# Patient Record
Sex: Male | Born: 1945 | Hispanic: Yes | Marital: Married | Smoking: Former smoker
Health system: Southern US, Community
[De-identification: ages and names within clinical notes are randomized; demographics above are authoritative.]

## PROBLEM LIST (undated history)

## (undated) DIAGNOSIS — E785 Hyperlipidemia, unspecified: Secondary | ICD-10-CM

## (undated) DIAGNOSIS — M1990 Unspecified osteoarthritis, unspecified site: Secondary | ICD-10-CM

## (undated) DIAGNOSIS — R12 Heartburn: Secondary | ICD-10-CM

## (undated) DIAGNOSIS — I10 Essential (primary) hypertension: Secondary | ICD-10-CM

## (undated) DIAGNOSIS — M75100 Unspecified rotator cuff tear or rupture of unspecified shoulder, not specified as traumatic: Secondary | ICD-10-CM

## (undated) DIAGNOSIS — M170 Bilateral primary osteoarthritis of knee: Secondary | ICD-10-CM

## (undated) DIAGNOSIS — R7303 Prediabetes: Secondary | ICD-10-CM

## (undated) DIAGNOSIS — M25511 Pain in right shoulder: Secondary | ICD-10-CM

## (undated) DIAGNOSIS — N400 Enlarged prostate without lower urinary tract symptoms: Secondary | ICD-10-CM

## (undated) HISTORY — DX: Heartburn: R12

## (undated) HISTORY — PX: OTHER SURGICAL HISTORY: SHX169

## (undated) HISTORY — DX: Essential (primary) hypertension: I10

## (undated) HISTORY — DX: Unspecified osteoarthritis, unspecified site: M19.90

## (undated) HISTORY — DX: Hyperlipidemia, unspecified: E78.5

---

## 2010-06-23 ENCOUNTER — Ambulatory Visit: Payer: Self-pay | Admitting: Internal Medicine

## 2010-12-19 IMAGING — US US PELVIS LIMITED
1 series · 17 of 25 positions shown · non-contrast
Comparison: none

REASON FOR EXAM: R TESTICULAR PAIN
COMMENTS:

[Series 1: us pelvis limited · 17 of 97 slices shown]
[im 1/97]
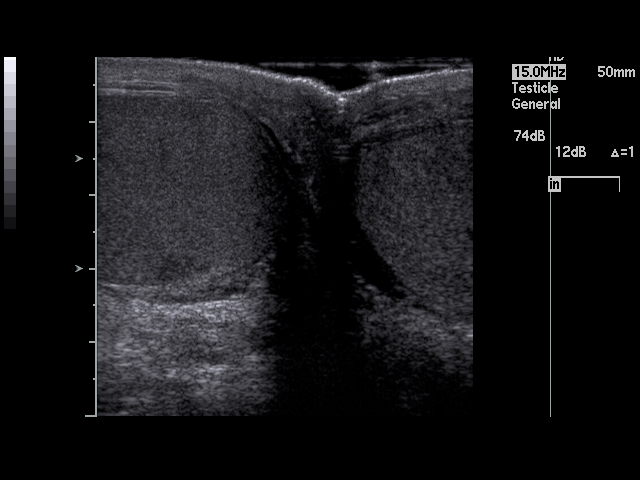
[im 9/97]
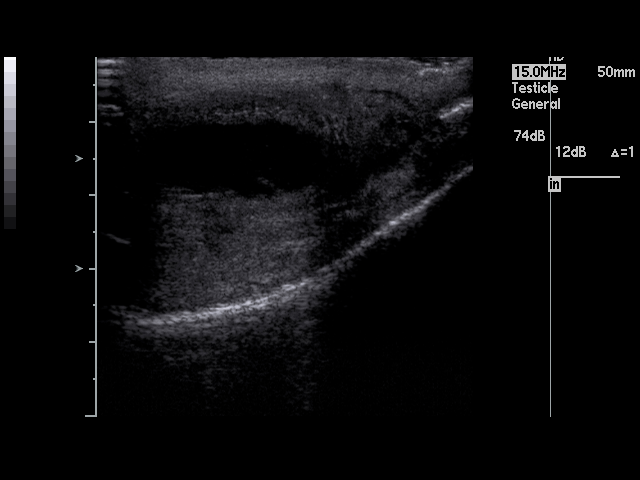
[im 13/97]
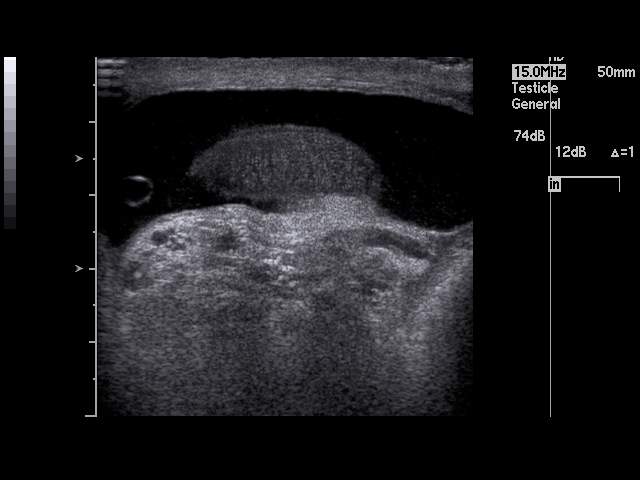
[im 21/97]
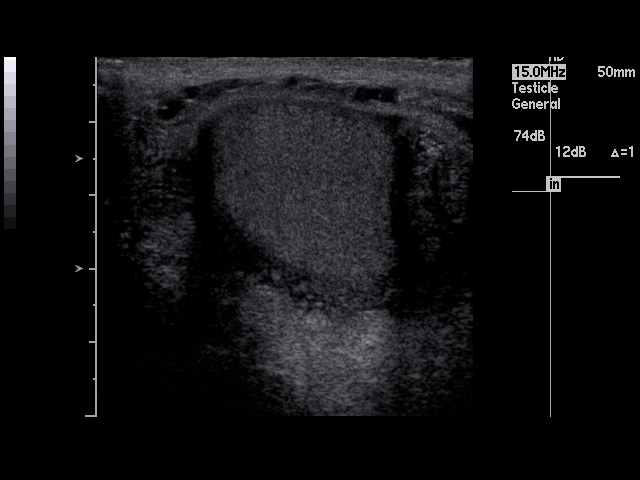
[im 25/97]
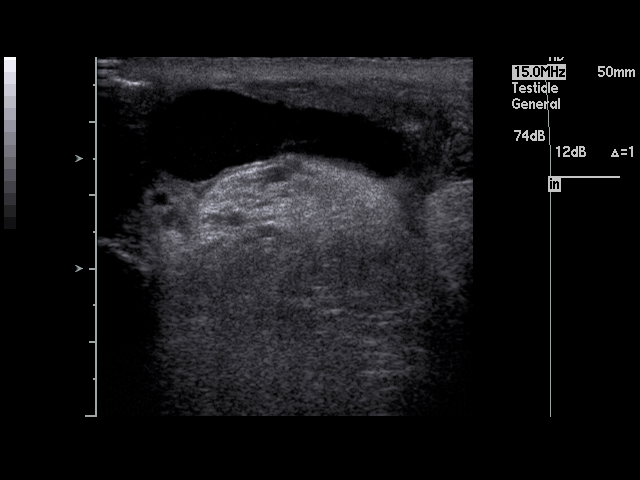
[im 33/97]
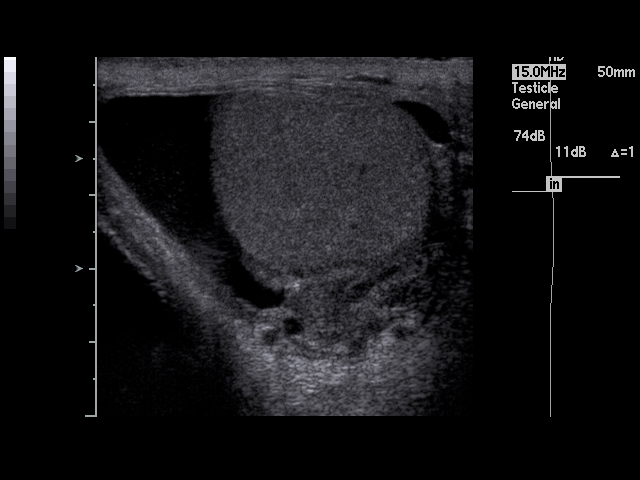
[im 37/97]
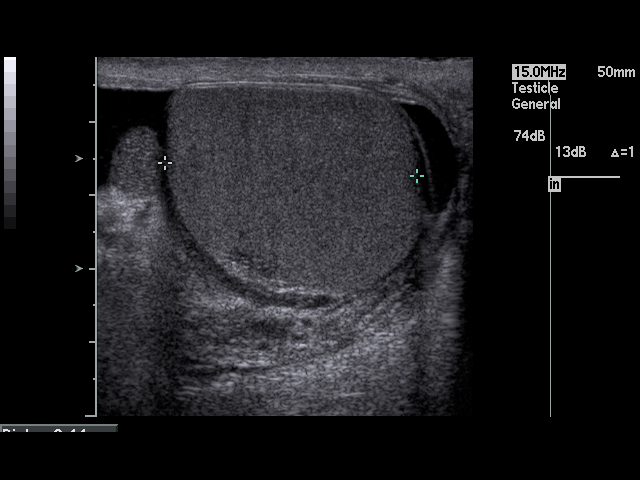
[im 45/97]
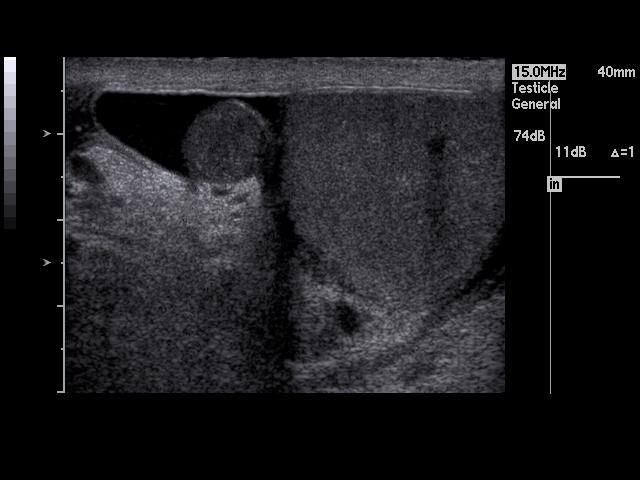
[im 49/97]
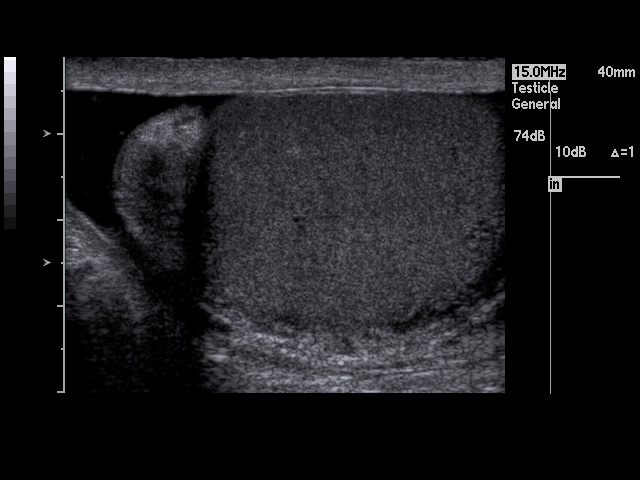
[im 53/97]
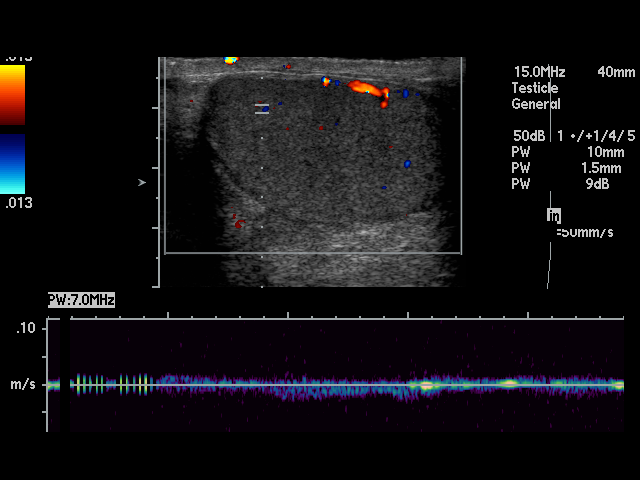
[im 61/97]
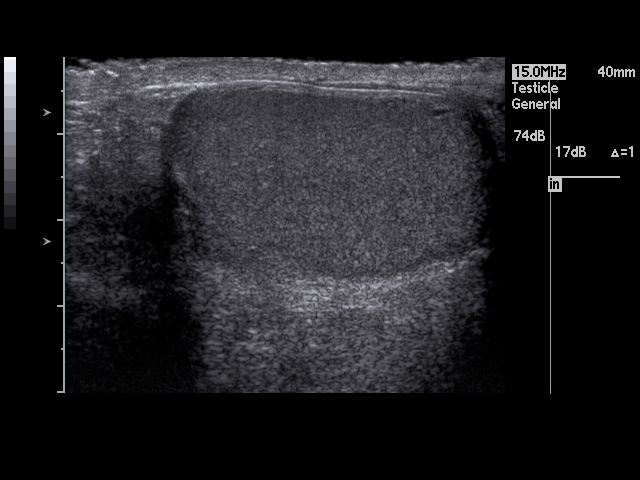
[im 65/97]
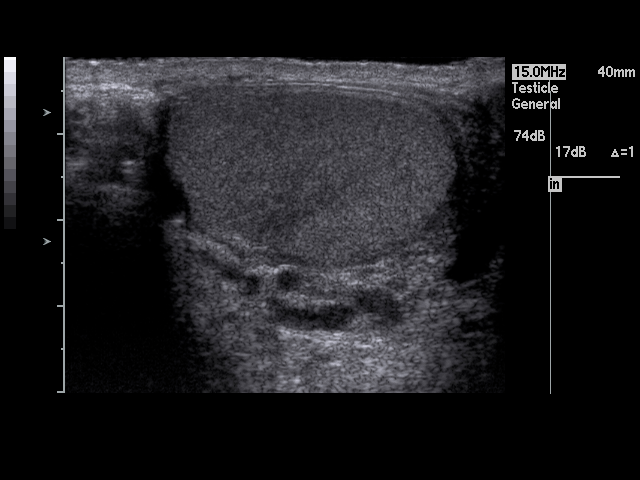
[im 73/97]
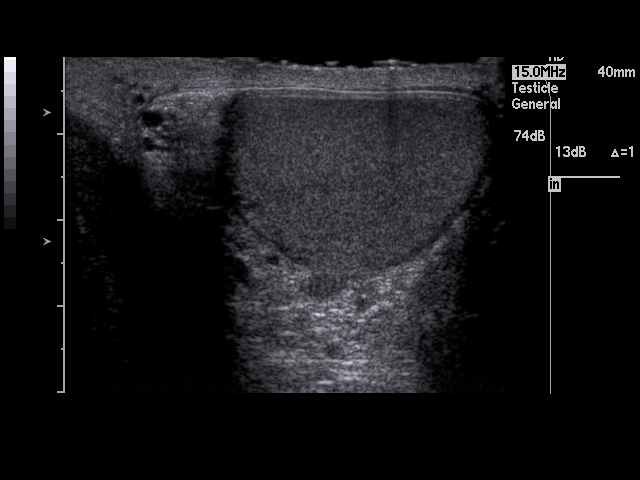
[im 77/97]
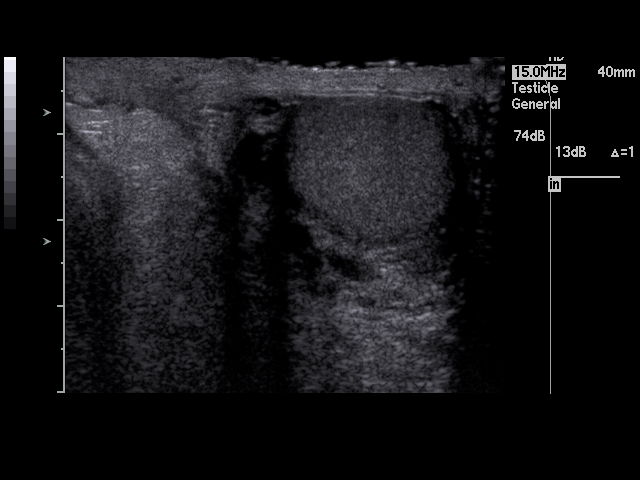
[im 85/97]
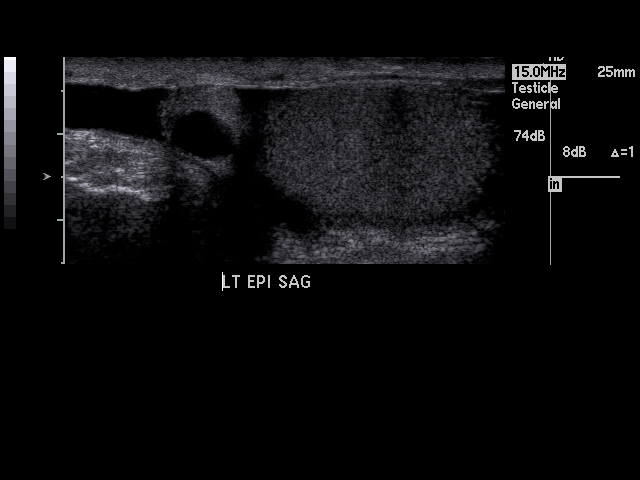
[im 89/97]
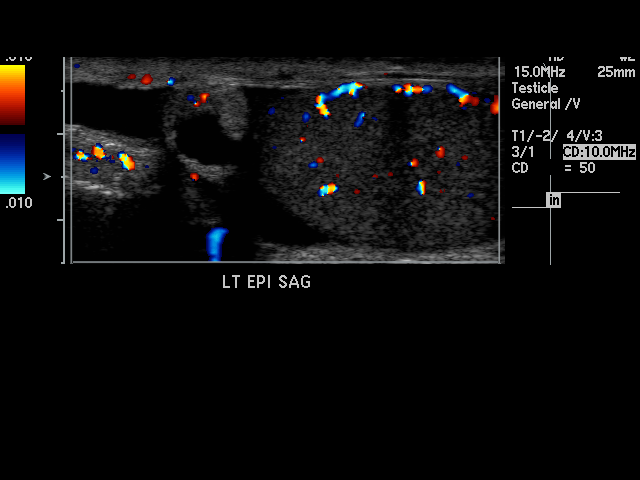
[im 97/97]
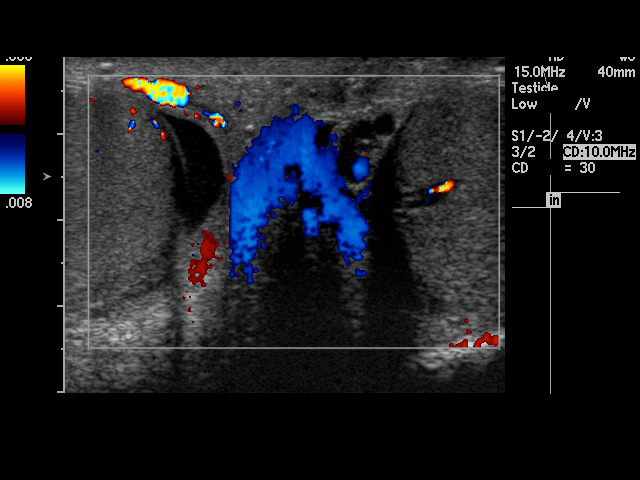

[17 of 25 positions shown; findings below may reference images not displayed]

PROCEDURE:     KODAK - KODAK TESTICULAR  - June 23, 2010  [DATE]

RESULT:     The right testicle measures 4.78 cm x 3.44 cm x 3.01 cm and the
left testicle measures 4.38 cm x 3.41 cm x 2.4 cm. No intratesticular mass
lesions are seen. Doppler examination shows venous and arterial vascular
flow in each testicle. There is no evidence for torsion. The right
epididymis is larger than the left and is hyperemic which suggest right
epididymitis. There is a 7.6 mm epididymal cyst on the left. Bilateral
hydroceles are observed. There is a relatively small varicocele on the left.
IMPRESSION: 1. No torsion is identified.
2. The right epididymis is hyperemic, suspicious for right epididymitis.
3. There is a left epididymal cyst.
4. Bilateral hydroceles are seen.
5. There is a small left varicocele.

## 2012-03-29 ENCOUNTER — Ambulatory Visit: Payer: Self-pay | Admitting: Urology

## 2012-05-31 ENCOUNTER — Ambulatory Visit: Payer: Self-pay | Admitting: Urology

## 2012-05-31 DIAGNOSIS — I10 Essential (primary) hypertension: Secondary | ICD-10-CM

## 2012-05-31 LAB — CBC
HGB: 14.8 g/dL (ref 13.0–18.0)
MCH: 31.7 pg (ref 26.0–34.0)
MCV: 95 fL (ref 80–100)
Platelet: 189 10*3/uL (ref 150–440)
RBC: 4.67 10*6/uL (ref 4.40–5.90)
RDW: 13 % (ref 11.5–14.5)

## 2012-05-31 LAB — POTASSIUM: Potassium: 4.3 mmol/L (ref 3.5–5.1)

## 2012-06-18 ENCOUNTER — Ambulatory Visit: Payer: Self-pay | Admitting: Urology

## 2012-09-24 IMAGING — US US PELVIS LIMITED
1 series · 13 of 25 positions shown · non-contrast
Comparison: none

REASON FOR EXAM: hydrocele
COMMENTS:

[Series 1: us pelvis limited · 0.10mm/px · 13 of 78 slices shown]
[im 1/78]
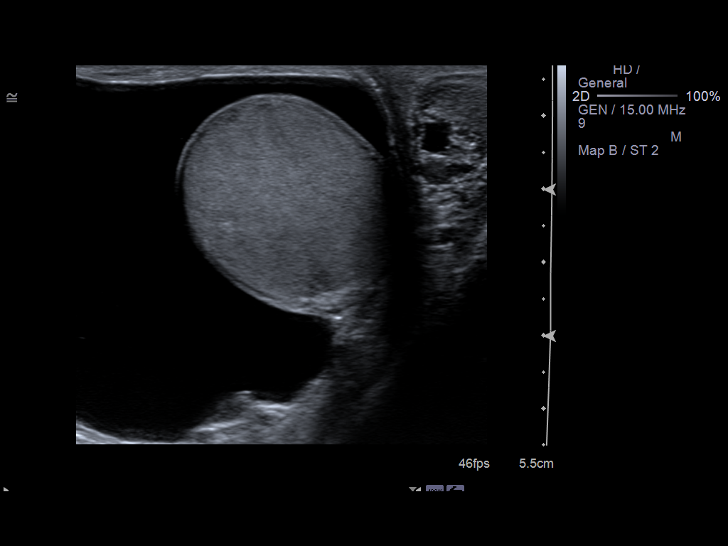
[im 7/78]
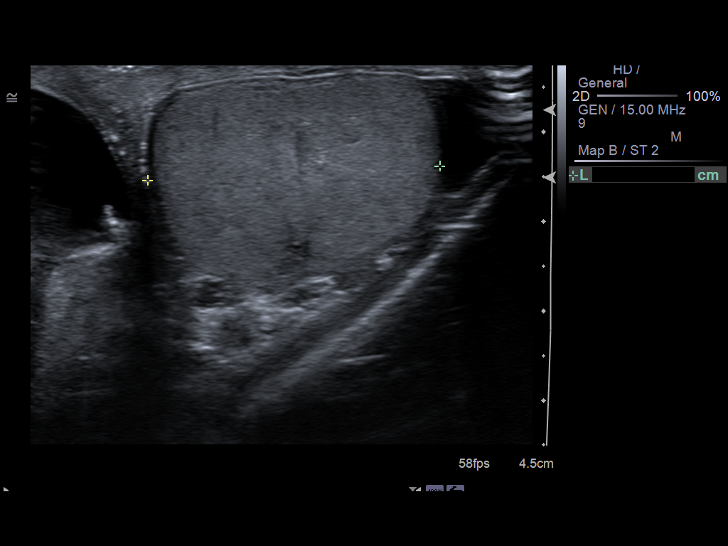
[im 13/78]
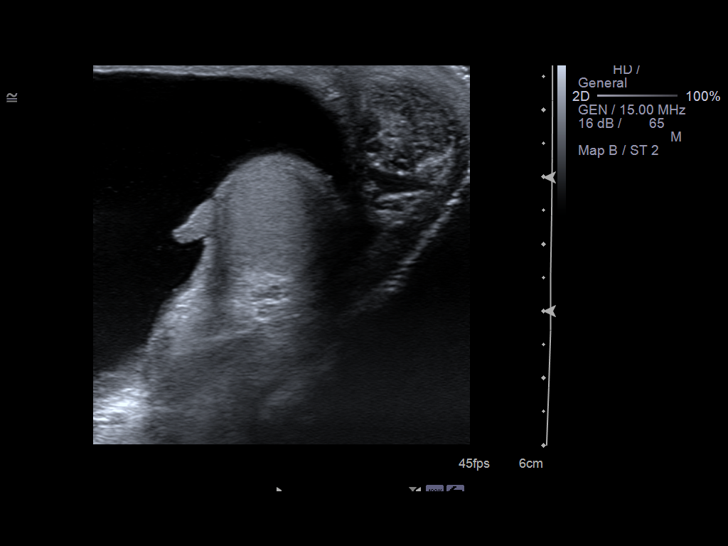
[im 20/78]
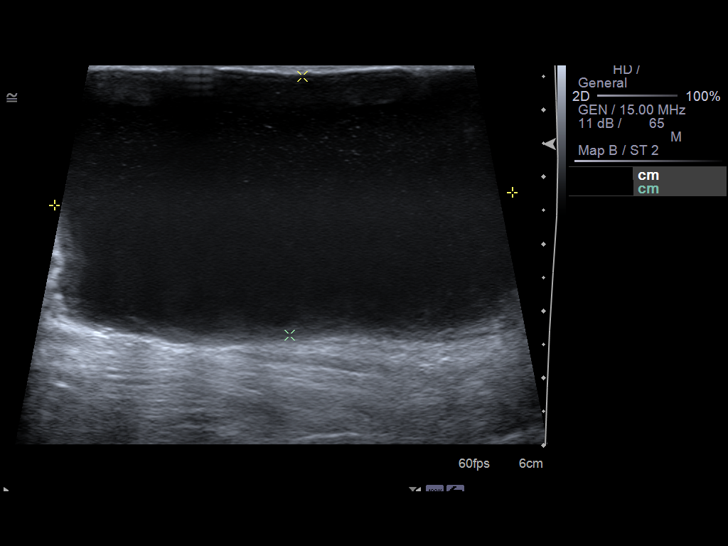
[im 26/78]
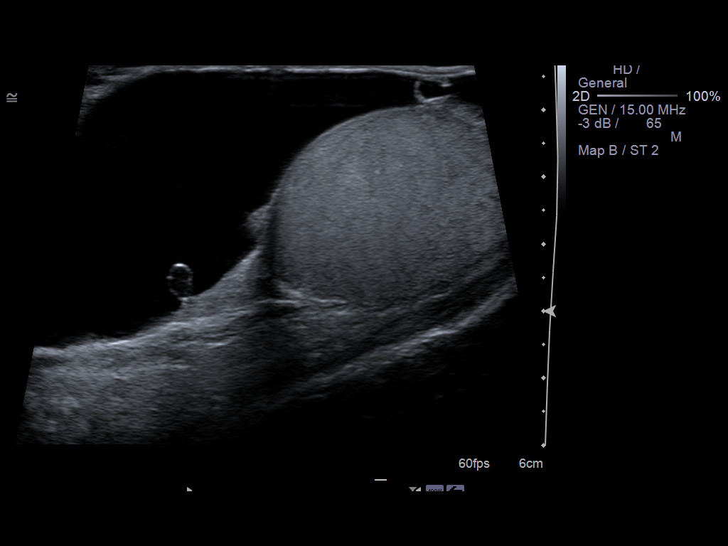
[im 33/78]
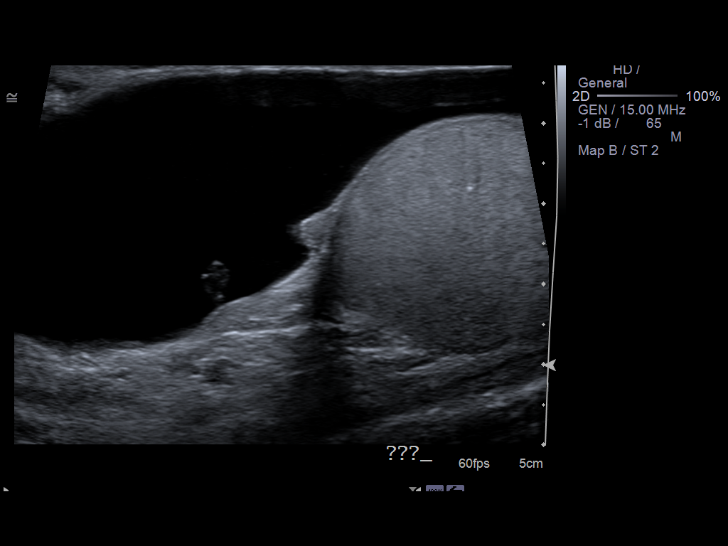
[im 39/78]
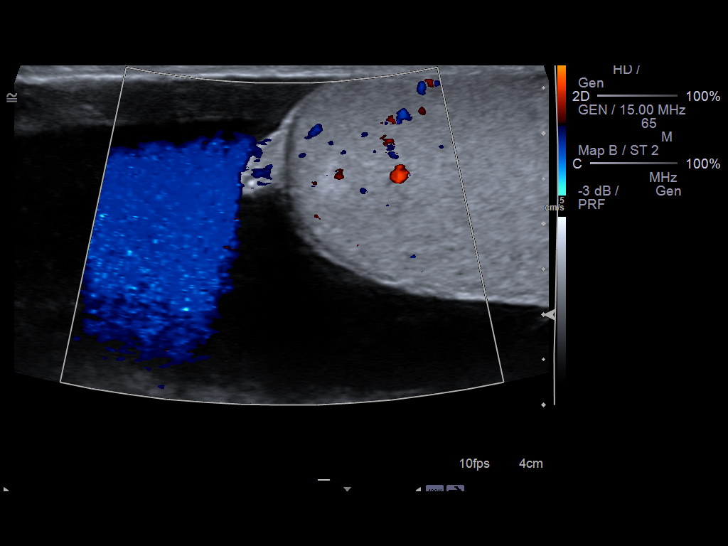
[im 45/78]
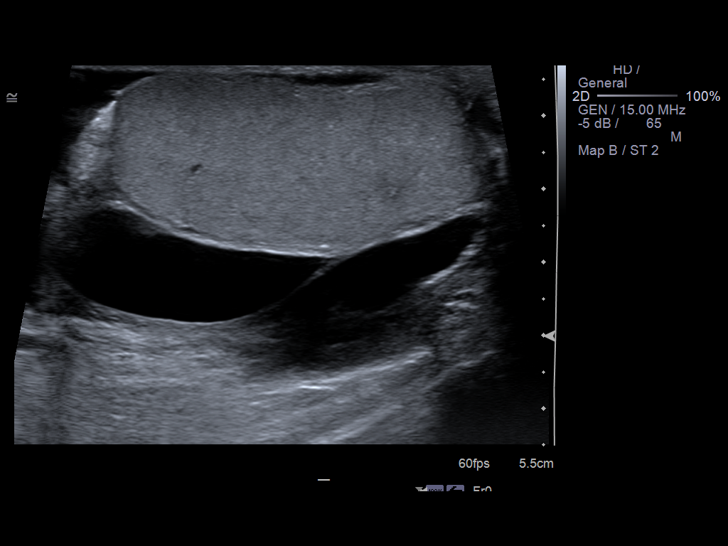
[im 52/78]
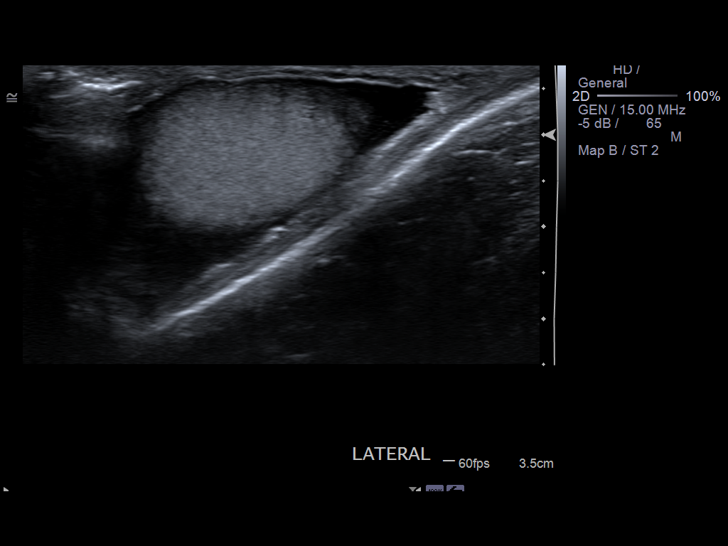
[im 58/78]
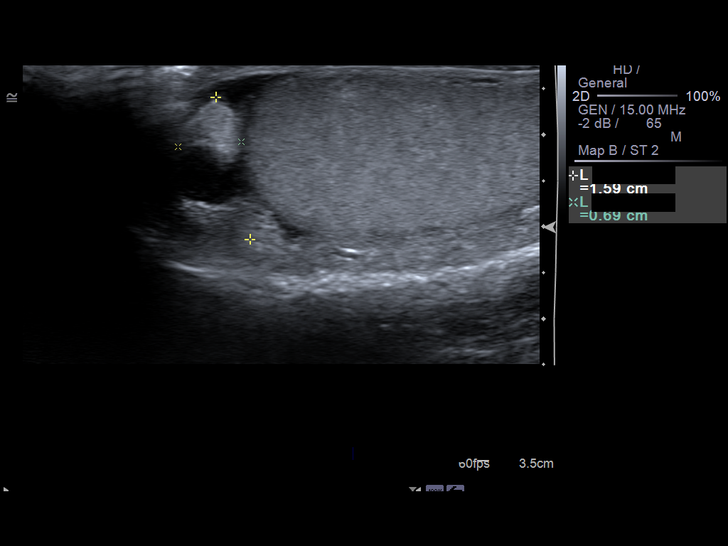
[im 65/78]
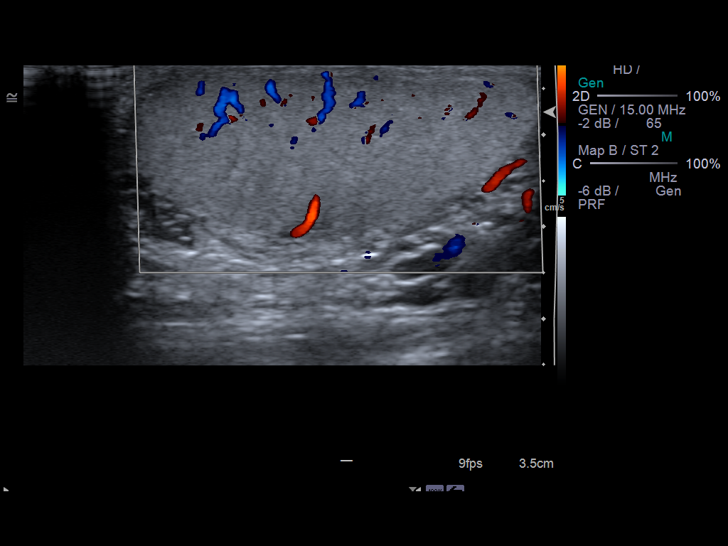
[im 71/78]
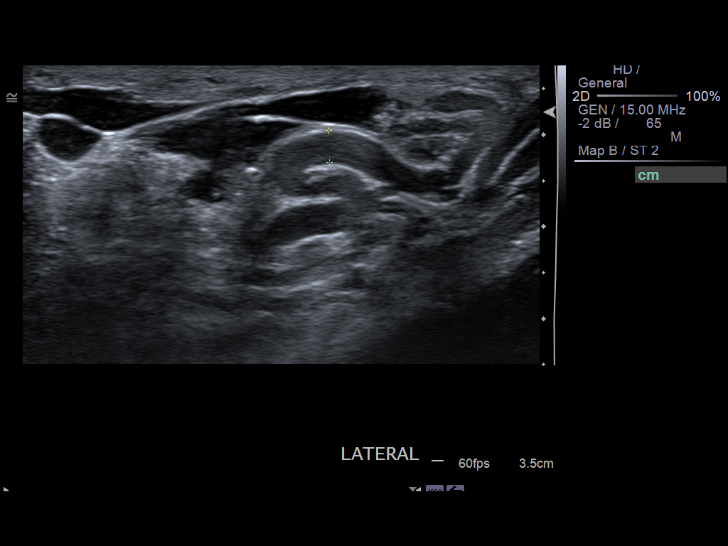
[im 78/78]
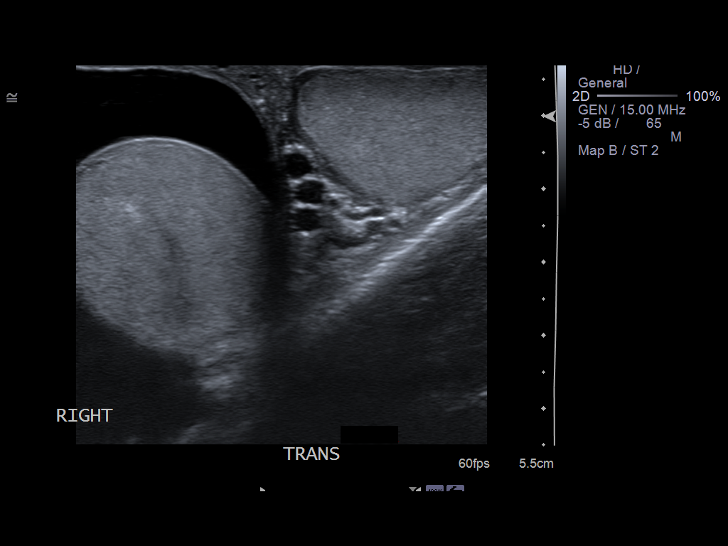

[13 of 25 positions shown; findings below may reference images not displayed]

PROCEDURE:     PALLARES - PALLARES TESTICULAR  - March 29, 2012  [DATE]

RESULT:     The patient has a history of bilateral hydroceles and a left
varicocele. The patient also has a history of an epididymal cyst on the left
seen in June 2010.

The testes exhibit normal echotexture. There is no finding suspicious for
torsion or intratesticular masses. There is a large right hydrocele
measuring 6.8 x 3.9 x 6.9 cm. There is a cystic structure demonstrated also
on the right outside of the testicle that measures approximately 5 x 4 x 4
mm. This lies superior to the testicle.

The epididymal structures on the right could not be adequately demonstrated.
On the left the epididymis is normal in appearance and contains a 7 mm
diameter cyst. A small varicocele is again demonstrated.
IMPRESSION: 1. I do not see abnormal normal intratesticular masses.
2. There is a large hydrocele on the right which contains septations. There
is also a subcentimeter cystic structure adjacent to the right testicle
which measures 5 mm in diameter.
2. The epididymal structures on the left appear normal in size with no
inflammatory change. A 7 mm diameter epididymal cyst is demonstrated.
3. On the right the epididymis could not be demonstrated.
4. There is a small hydrocele on the left and a varicocele is also
demonstrated on the left.

[REDACTED]

## 2015-02-06 ENCOUNTER — Ambulatory Visit
Admit: 2015-02-06 | Disposition: A | Discharge status: Home / Self Care | Payer: Self-pay | Attending: Primary Care | Admitting: Primary Care

## 2015-02-24 NOTE — Op Note (Signed)
PATIENT NAME:  Jacob Sellers, Jacob Sellers MR#:  [id number] DATE OF BIRTH:  1946-05-04  DATE OF PROCEDURE:  06/18/2012  PREOPERATIVE DIAGNOSIS: Right hydrocele.   OPERATION:  1. Hydrocelectomy.  2. Spermatocelectomy. 3. Excision of appendix testis.   ANESTHESIA: General.   SURGEON: Rica KoyanagiJohn S. Maurice Fotheringham, MD    INDICATIONS: This 69 year old man has a large hydrocele on the right which has been uncomfortable and interferes with his work. He has requested consideration for hydrocelectomy. Preliminary examination revealed that he is in adequate physical condition to undergo general anesthesia and surgery.   PROCEDURE: In the supine position under general anesthesia, the genital area was prepped and draped. An incision was made into the right scrotal compartment through the raphe. Bleeding points were identified and cauterized or ligated with chromic catgut. Hydrocele and testis on that side were dissected free from surrounding structures and delivered into the operative field. The hydrocele was then opened, drained, and trimmed from the surrounding structures leaving an approximate centimeter margin at the epididymis and the testicular margin. Bleeding points were sought and cauterized or ligated. A spermatocele was noted to be present near the globus major. These were dissected free and the base ligated. An appendix testis was removed and submitted. The appendix epididymis was cauterized. The scrotal area was searched for bleeding points and the hydrocele sac inverted to avoid recollection of fluid. The testis was then replaced in the scrotum. Two stay sutures through the scrotal septum through the adventitial areas of the testis were placed. 3-0 chromic catgut was used to close the scrotal layers and interrupted 3-0 chromic catgut used to close scrotal skin. A fluffy bulky dressing was applied. Blood loss was less than 10 mL.     The patient tolerated the procedure well and returned to the recovery area in  satisfactory condition.   ____________________________ Rica KoyanagiJohn S. Amori Colomb, MD jsh:drc D: 06/18/2012 13:57:00 ET T: 06/18/2012 14:21:11 ET JOB#: [id number]  cc: Rica KoyanagiJohn S. Mariamawit Depaoli, MD, <Dictator> Rica KoyanagiJOHN S Shalanda Brogden MD ELECTRONICALLY SIGNED 06/21/2012 14:01

## 2015-03-26 ENCOUNTER — Ambulatory Visit
Admission: RE | Admit: 2015-03-26 | Discharge: 2015-03-26 | Disposition: A | Discharge status: Home / Self Care | Payer: Medicare Other | Source: Ambulatory Visit | Attending: Primary Care | Admitting: Primary Care

## 2015-03-26 ENCOUNTER — Other Ambulatory Visit: Payer: Self-pay | Admitting: Primary Care

## 2015-03-26 DIAGNOSIS — R05 Cough: Secondary | ICD-10-CM

## 2015-03-26 DIAGNOSIS — R059 Cough, unspecified: Secondary | ICD-10-CM

## 2015-09-21 IMAGING — CR DG CHEST 2V
1 series · 2 of 2 positions shown · non-contrast
Comparison: 02/06/2015

CLINICAL DATA: cough.  Persistent since Khary Chin.

EXAM:
CHEST  2 VIEW

[Series 1: w chest pa · 0.14mm/px · 2 of 2 slices shown]
[im 1/2]
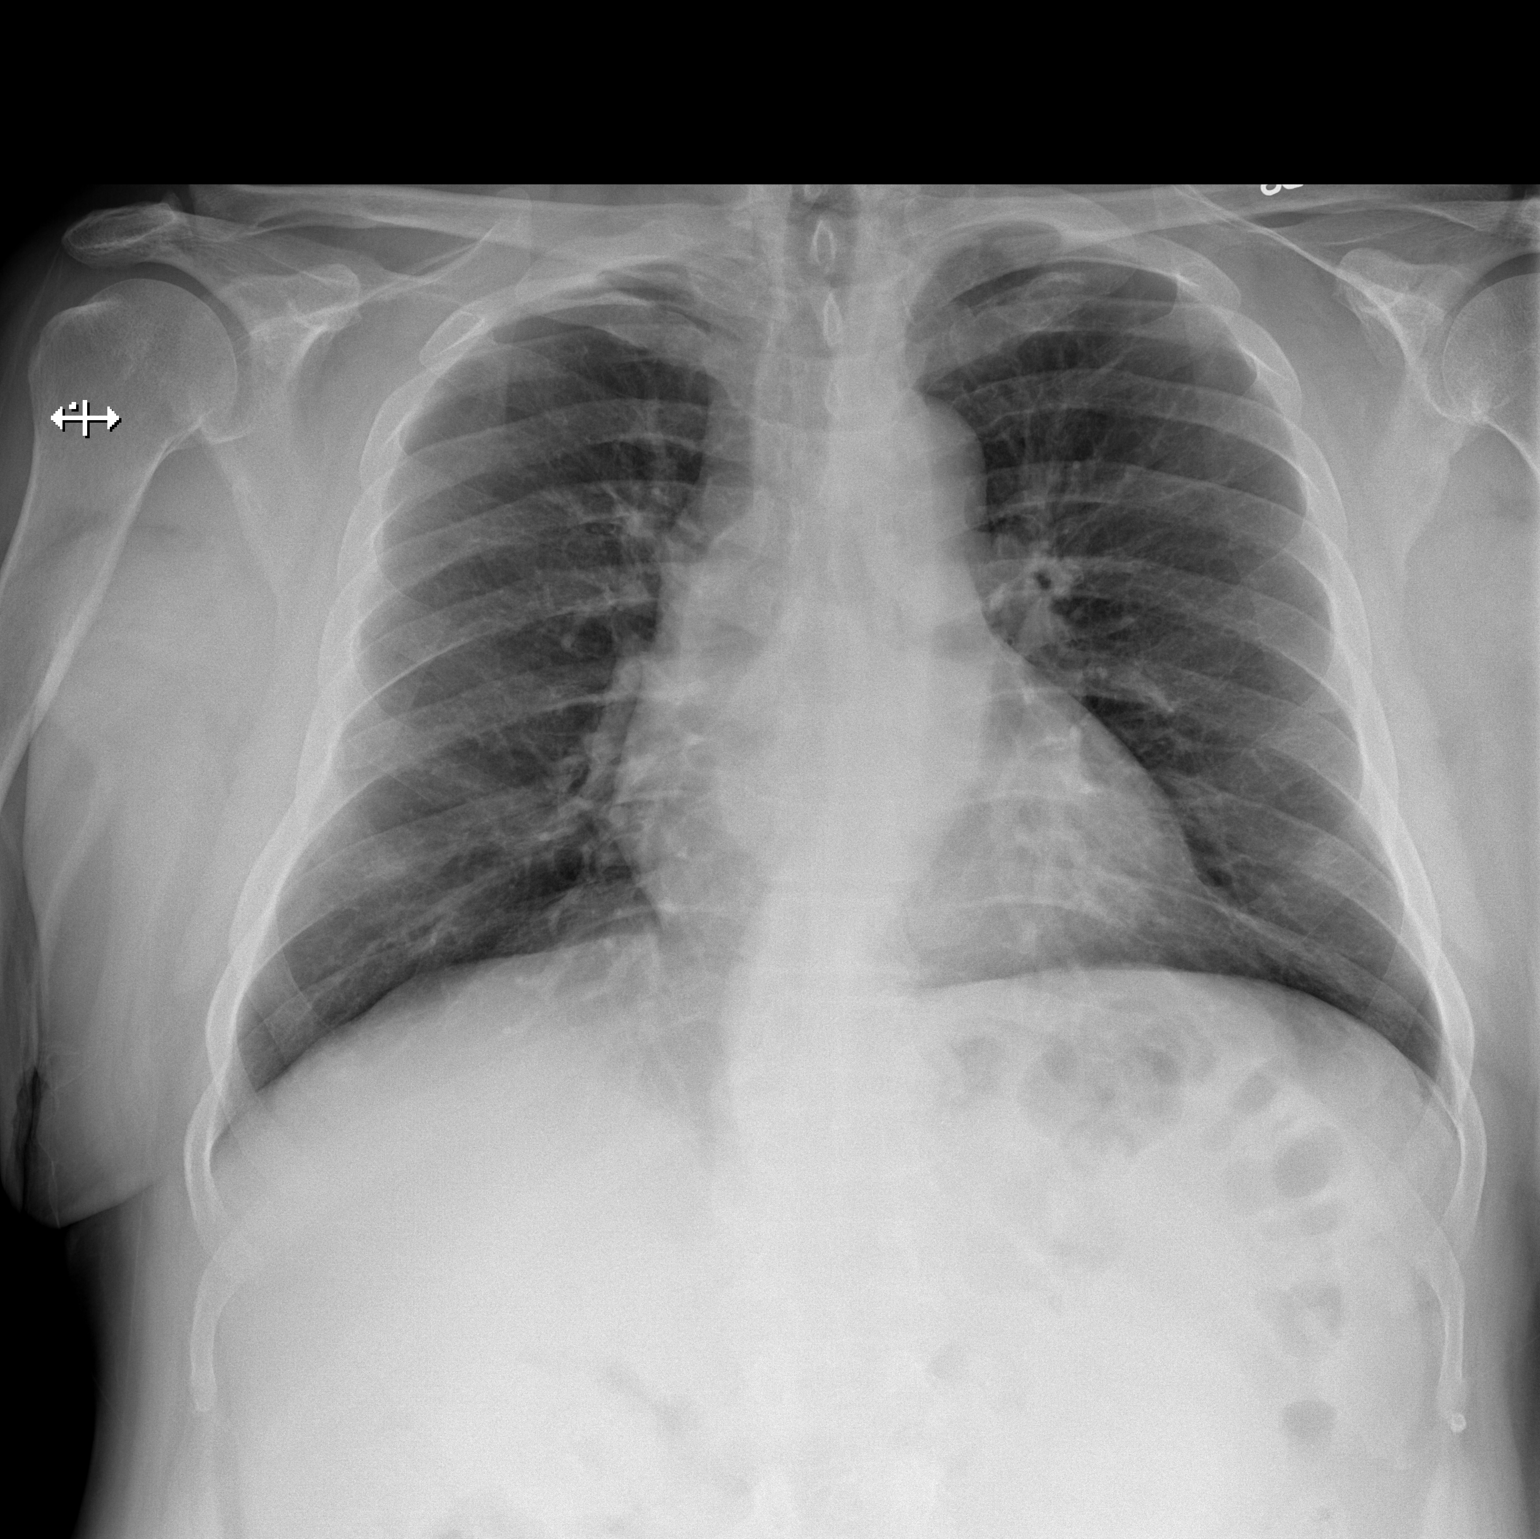
[im 2/2]
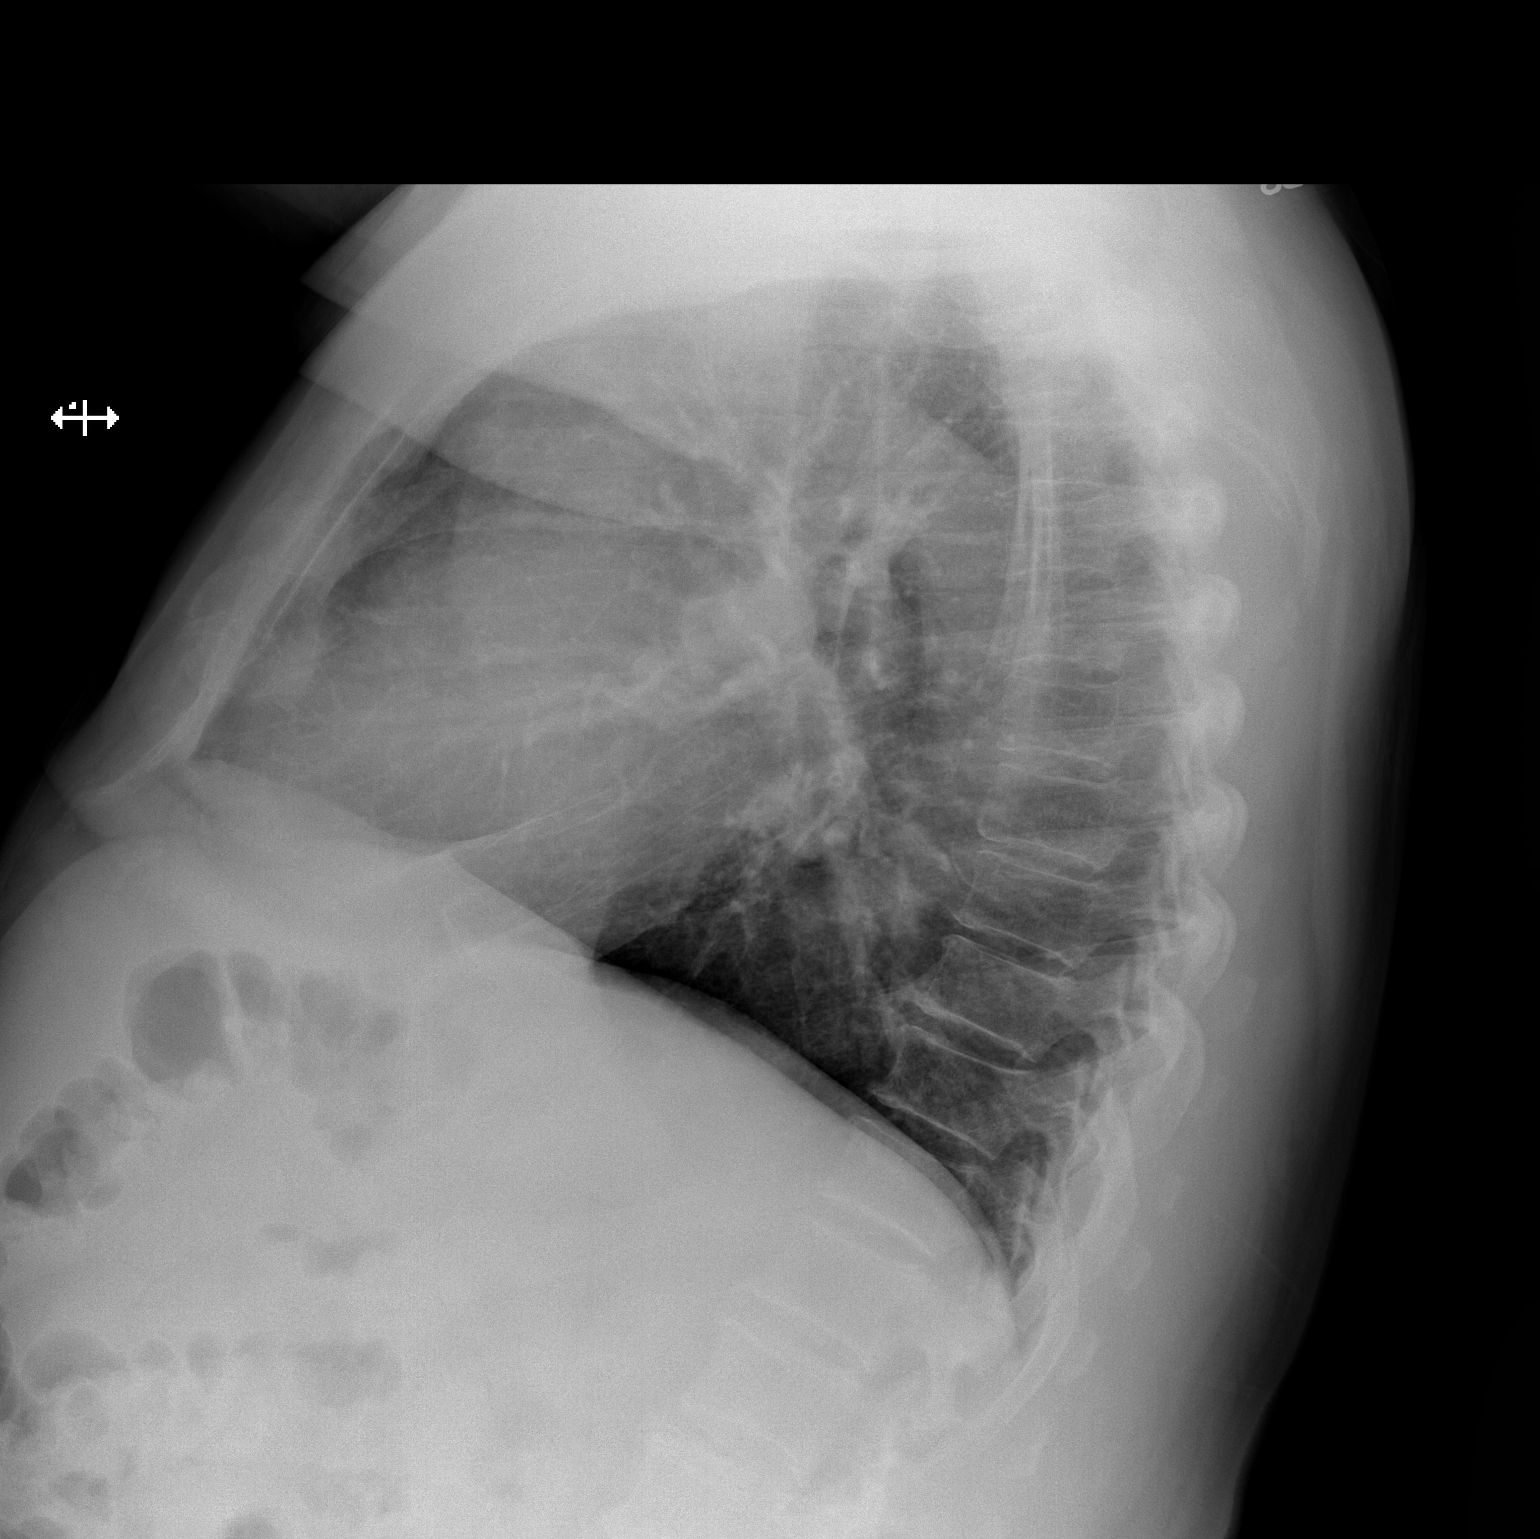

[2 of 2 positions shown; findings below may reference images not displayed]

FINDINGS: Heart size is normal. The aorta is unfolded. There is no pulmonary
infiltrate. There may be mild central bronchial thickening. No
effusions. Density on the lateral view in the retrosternal region is
again noted, but I think is most usually related to chest wall
structures. This does not appear suspicious to me.
IMPRESSION: Bronchitis.  No pneumonia or collapse.

Retrosternal density questioned previously is unchanged and probably
relates to chest wall structures. If the patient is not at high risk
of lung cancer, my opinion is that this does not need further
follow-up. If the patient is at increased risk of lung cancer,
continued chest radiographic follow-up or chest CT could be
considered.

## 2016-06-02 ENCOUNTER — Ambulatory Visit (INDEPENDENT_AMBULATORY_CARE_PROVIDER_SITE_OTHER): Payer: Medicare Other | Admitting: Urology

## 2016-06-02 ENCOUNTER — Encounter: Payer: Self-pay | Admitting: Urology

## 2016-06-02 VITALS — BP 157/74 | HR 75 | Ht 60.0 in | Wt 209.2 lb

## 2016-06-02 DIAGNOSIS — N400 Enlarged prostate without lower urinary tract symptoms: Secondary | ICD-10-CM

## 2016-06-02 DIAGNOSIS — N401 Enlarged prostate with lower urinary tract symptoms: Secondary | ICD-10-CM | POA: Diagnosis not present

## 2016-06-02 DIAGNOSIS — N138 Other obstructive and reflux uropathy: Secondary | ICD-10-CM

## 2016-06-02 LAB — BLADDER SCAN AMB NON-IMAGING: SCAN RESULT: 122

## 2016-06-02 NOTE — Progress Notes (Signed)
06/02/2016 3:52 PM   Jacob Sellers May 18, 1946 [id number]  Referring provider: Sandrea Hughs, NP [address] [place], [place] [address]  Chief Complaint  Patient presents with  . 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    Patient presents with interpreter, Jacob Sellers, with the complaints of BPH with LUTS that had been occurring for over 2 years.  His IPSS score today is 28, which is severe lower urinary tract symptomatology. He is unhappy with his quality life due to his urinary symptoms. His PVR is 122 mL.   His major complaint today is frequency, nocturia, intermittency, hesitancy, straining to urinate and a weak urinary stream.  He denies any dysuria, hematuria or suprapubic pain.   He currently taking finasteride 5 mg daily and doxazosin 4 mg daily.  He also denies any recent fevers, chills, nausea or vomiting.  He does not have a family history of PCa.      IPSS    Row Name 06/02/16 1500         International Prostate Symptom Score   How often have you had the sensation of not emptying your bladder? Almost always     How often have you had to urinate less than every two hours? More than half the time     How often have you found you stopped and started again several times when you urinated? Less than half the time     How often have you found it difficult to postpone urination? Almost always     How often have you had a weak urinary stream? Almost always     How often have you had to strain to start urination? About half the time     How many times did you typically get up at night to urinate? 4 Times     Total IPSS Score 28       Quality of Life due to urinary symptoms   If you were to spend the rest of your life with your urinary condition just the way it is now how would you feel about that?  Unhappy        Score:  1-7 Mild 8-19 Moderate 20-35 Severe    PMH: Past Medical History:  Diagnosis Date  . Arthritis   . Heartburn arthritis  . HLD (hyperlipidemia)   . Hypertension     Surgical History: History reviewed. No pertinent surgical history.  Home Medications:    Medication List       Accurate as of 06/02/16  3:52 PM. Always use your most recent med list.          aspirin EC 81 MG tablet Take 81 mg by mouth daily.   atenolol 100 MG tablet Commonly known as:  TENORMIN Take 100 mg by mouth daily.   doxazosin 4 MG tablet Commonly known as:  CARDURA Take 4 mg by mouth daily.   finasteride 5 MG tablet Commonly known as:  PROSCAR   hydrochlorothiazide 25 MG tablet Commonly known as:  HYDRODIURIL   losartan 50 MG tablet Commonly known as:  COZAAR Take 50 mg by mouth daily.   naproxen 500 MG tablet Commonly known as:  NAPROSYN   Omega 3 1000 MG Caps Take by mouth.   saw palmetto 160 MG capsule Take 160 mg  by mouth 2 (two) times daily.   simvastatin 40 MG tablet Commonly known as:  ZOCOR Take 40 mg by mouth daily.       Allergies: No Known Allergies  Family History: Family History  Problem Relation Age of Onset  . Prostate cancer Neg Hx   . Kidney disease Neg Hx     Social History:  reports that he has quit smoking. He has never used smokeless tobacco. He reports that he drinks alcohol. His drug history is not on file.  ROS: UROLOGY Frequent Urination?: Yes Hard to postpone urination?: No Burning/pain with urination?: No Get up at night to urinate?: Yes Leakage of urine?: No Urine stream starts and stops?: Yes Trouble starting stream?: Yes Do you have to strain to urinate?: Yes Blood in urine?: No Urinary tract infection?: No Sexually transmitted disease?: No Injury to kidneys or bladder?: No Painful intercourse?: No Weak stream?: Yes Erection problems?: No Penile pain?: No  Gastrointestinal Nausea?: No Vomiting?:  No Indigestion/heartburn?: No Diarrhea?: No Constipation?: No  Constitutional Fever: No Night sweats?: No Weight loss?: No Fatigue?: No  Skin Skin rash/lesions?: No Itching?: No  Eyes Blurred vision?: No Double vision?: No  Ears/Nose/Throat Sore throat?: No Sinus problems?: No  Hematologic/Lymphatic Swollen glands?: No Easy bruising?: No  Cardiovascular Leg swelling?: No Chest pain?: No  Respiratory Cough?: Yes Shortness of breath?: No  Endocrine Excessive thirst?: No  Musculoskeletal Back pain?: No Joint pain?: No  Neurological Headaches?: No Dizziness?: No  Psychologic Depression?: No Anxiety?: No  Physical Exam: BP (!) 157/74   Pulse 75   Ht 5' (1.524 m)   Wt 209 lb 3.2 oz (94.9 kg)   BMI 40.86 kg/m   Constitutional: Well nourished. Alert and oriented, No acute distress. HEENT: [place] AT, moist mucus membranes. Trachea midline, no masses. Cardiovascular: No clubbing, cyanosis, or edema. Respiratory: Normal respiratory effort, no increased work of breathing. GI: Abdomen is soft, non tender, non distended, no abdominal masses. Liver and spleen not palpable.  No hernias appreciated.  Stool sample for occult testing is not indicated.   GU: No CVA tenderness.  No bladder fullness or masses.  Patient with uncircumcised phallus. Foreskin easily retracted  Urethral meatus is patent.  No penile discharge. No penile lesions or rashes. Scrotum without lesions, cysts, rashes and/or edema.  Testicles are located scrotally bilaterally. No masses are appreciated in the testicles. Left and right epididymis are normal. Rectal: Patient with  normal sphincter tone. Anus and perineum without scarring or rashes. No rectal masses are appreciated. Prostate is approximately 55 grams, no nodules are appreciated. Seminal vesicles are normal. Skin: No rashes, bruises or suspicious lesions. Lymph: No cervical or inguinal adenopathy. Neurologic: Grossly intact, no focal  deficits, moving all 4 extremities. Psychiatric: Normal mood and affect.  Laboratory Data: Lab Results  Component Value Date   WBC 6.9 05/31/2012   HGB 14.8 05/31/2012   HCT 44.4 05/31/2012   MCV 95 05/31/2012   PLT 189 05/31/2012   Results for orders placed or performed in visit on 06/02/16  PSA  Result Value Ref Range   Prostate Specific Ag, Serum 0.8 0.0 - 4.0 ng/mL  BLADDER SCAN AMB NON-IMAGING  Result Value Ref Range   Scan Result 122       Assessment & Plan:    1.  BPH with LUTS  - IPSS score is 28/5   - Continue conservative management, avoiding bladder irritants and timed voiding's  - Continue doxazosin 4 mg daily and finasteride 5  mg daily  - Failed maximum medical therapy with alpha blockers and 5 alpha reductase inhibitors,  schedule cystoscopy   - BLADDER SCAN AMB NON-IMAGING   Return for cystoscopy to evaluate for BOO.  These notes generated with voice recognition software. I apologize for typographical errors.  Michiel Cowboy, PA-C  [facility] [address] [place], [place] [address] [phone]

## 2016-06-03 LAB — PSA: PROSTATE SPECIFIC AG, SERUM: 0.8 ng/mL (ref 0.0–4.0)

## 2016-06-07 ENCOUNTER — Telehealth: Payer: Self-pay | Admitting: *Deleted

## 2016-06-07 NOTE — Telephone Encounter (Signed)
Spoke with patient and gave results. 

## 2016-06-07 NOTE — Telephone Encounter (Signed)
-----   Message from Harle Battiest, PA-C sent at 06/03/2016  7:57 AM EDT ----- Please notify the patient that his PSA is normal.

## 2016-06-27 ENCOUNTER — Ambulatory Visit (INDEPENDENT_AMBULATORY_CARE_PROVIDER_SITE_OTHER): Payer: Medicare Other | Admitting: Urology

## 2016-06-27 DIAGNOSIS — N358 Other urethral stricture: Secondary | ICD-10-CM

## 2016-06-27 DIAGNOSIS — IMO0002 Reserved for concepts with insufficient information to code with codable children: Secondary | ICD-10-CM

## 2016-06-27 DIAGNOSIS — N400 Enlarged prostate without lower urinary tract symptoms: Secondary | ICD-10-CM | POA: Diagnosis not present

## 2016-06-27 MED ORDER — CIPROFLOXACIN HCL 500 MG PO TABS
500.0000 mg | ORAL_TABLET | Freq: Once | ORAL | Status: AC
  Administered 2016-06-27: 500 mg via ORAL

## 2016-06-27 MED ORDER — LIDOCAINE HCL 2 % EX GEL
1.0000 "application " | Freq: Once | CUTANEOUS | Status: AC
  Administered 2016-06-27: 1 via URETHRAL

## 2016-06-27 NOTE — Progress Notes (Signed)
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  His IPSS score is 28, which is severe lower urinary tract symptomatology. He is unhappy with his quality life due to his urinary symptoms. His PVR was 122 mL.   His major complaint is frequency, nocturia, intermittency, hesitancy, straining to urinate and a weak urinary stream.  He denies any dysuria, hematuria or suprapubic pain.   He currently taking finasteride 5 mg daily and doxazosin 4 mg daily.  He also denies any recent fevers, chills, nausea or vomiting.  He does not have a family history of PCa. PSA Jul 2017 was  0.8.   Today, [name] is seen for cystoscopy and to discuss his options depending on the findings. 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    Past Medical History:  Diagnosis Date  . Arthritis   . Heartburn arthritis  . HLD (hyperlipidemia)   . Hypertension    No past surgical history on file.  Home Medications:   (Not in a hospital admission) Allergies: No Known Allergies  Family History  Problem Relation Age of Onset  . Prostate cancer Neg Hx   . Kidney disease Neg Hx    Social History:  reports that he has quit smoking. He has never used smokeless tobacco. He reports that he drinks alcohol. His drug history is not on file.  ROS: A complete review of systems was performed.  All systems are negative except for pertinent findings as noted. ROS   Procedure - Cystoscopy: after consent was obtained the patient was placed supine and prepped and draped in the usual sterile fashion. The cystoscope was inserted per urethra which was inspected and the scope was withdrawn. The patient tolerated the procedure well. We are using the digital scope with the monitor today, so the patient was able to see the anatomy.  Findings: Severe stricture of the bulbar urethra, not manipulated.  Laboratory Data:  No results found for this or any  previous visit (from the past 24 hour(s)). No results found for this or any previous visit (from the past 240 hour(s)). Creatinine: No results for input(s): CREATININE in the last 168 hours.  Impression/Assessment/plan:  Urethral stricture with LUTS - I discussed with the patient and the interpreter. I drew him a picture the anatomy. We discussed we are not certain of the length. We discussed the nature risks and benefits of surveillance, balloon dilation or formal urethroplasty per AUA Guideline. All questions answered. Because the patient has had symptoms for several years he does not desire continued surveillance. He elected to proceed with balloon dilation and we did discuss risks of infection, bleeding and incontinence among others as well as the likelihood of recurrence. All questions answered. We did discuss referral to specialist one of the universities. Again he would like to proceed with dilation first. We discussed we might consider CIC postoperatively. I discussed with him one of my colleagues would likely be doing the procedure.   Khandi Kernes 06/27/2016, 2:37 PM

## 2016-06-28 ENCOUNTER — Other Ambulatory Visit: Payer: Self-pay | Admitting: Radiology

## 2016-06-28 DIAGNOSIS — N359 Urethral stricture, unspecified: Secondary | ICD-10-CM

## 2016-06-29 ENCOUNTER — Other Ambulatory Visit: Payer: Medicare Other | Admitting: Urology

## 2016-07-05 ENCOUNTER — Encounter
Admission: RE | Admit: 2016-07-05 | Discharge: 2016-07-05 | Disposition: A | Discharge status: Home / Self Care | Payer: Medicare Other | Source: Ambulatory Visit | Attending: Urology | Admitting: Urology

## 2016-07-05 DIAGNOSIS — Z01812 Encounter for preprocedural laboratory examination: Secondary | ICD-10-CM | POA: Diagnosis not present

## 2016-07-05 DIAGNOSIS — Z01810 Encounter for preprocedural cardiovascular examination: Secondary | ICD-10-CM | POA: Insufficient documentation

## 2016-07-05 DIAGNOSIS — I10 Essential (primary) hypertension: Secondary | ICD-10-CM | POA: Diagnosis not present

## 2016-07-05 HISTORY — DX: Benign prostatic hyperplasia without lower urinary tract symptoms: N40.0

## 2016-07-05 LAB — BASIC METABOLIC PANEL
ANION GAP: 9 (ref 5–15)
BUN: 28 mg/dL — ABNORMAL HIGH (ref 6–20)
CALCIUM: 9.2 mg/dL (ref 8.9–10.3)
CHLORIDE: 98 mmol/L — AB (ref 101–111)
CO2: 29 mmol/L (ref 22–32)
Creatinine, Ser: 0.64 mg/dL (ref 0.61–1.24)
GFR calc non Af Amer: >60 mL/min (ref >60)
Glucose, Bld: 124 mg/dL — ABNORMAL HIGH (ref 65–99)
Potassium: 3.3 mmol/L — ABNORMAL LOW (ref 3.5–5.1)
SODIUM: 136 mmol/L (ref 135–145)

## 2016-07-05 LAB — CBC
HEMATOCRIT: 44.5 % (ref 40.0–52.0)
HEMOGLOBIN: 15.6 g/dL (ref 13.0–18.0)
MCH: 32.1 pg (ref 26.0–34.0)
MCHC: 35.1 g/dL (ref 32.0–36.0)
MCV: 91.6 fL (ref 80.0–100.0)
Platelets: 145 10*3/uL — ABNORMAL LOW (ref 150–440)
RBC: 4.86 MIL/uL (ref 4.40–5.90)
RDW: 13.3 % (ref 11.5–14.5)
WBC: 5.4 10*3/uL (ref 3.8–10.6)

## 2016-07-05 NOTE — Patient Instructions (Signed)
  Your procedure is scheduled on: 07/18/16 /Fri Report to Same Day Surgery 2nd floor medical mall To find out your arrival time please call [phone] between 1PM - 3PM on 07/17/16 Thurs  Remember: Instructions that are not followed completely may result in serious medical risk, up to and including death, or upon the discretion of your surgeon and anesthesiologist your surgery may need to be rescheduled.    _x___ 1. Do not eat food or drink liquids after midnight. No gum chewing or hard candies.     __x__ 2. No Alcohol for 24 hours before or after surgery.   ____3. No Smoking for 24 prior to surgery.   ____  4. Bring all medications with you on the day of surgery if instructed.    __x__ 5. Notify your doctor if there is any change in your medical condition     (cold, fever, infections).     Do not wear jewelry, make-up, hairpins, clips or nail polish.  Do not wear lotions, powders, or perfumes. You may wear deodorant.  Do not shave 48 hours prior to surgery. Men may shave face and neck.  Do not bring valuables to the hospital.    [facility] is not [facility] for any belongings or valuables.               Contacts, dentures or bridgework may not be worn into surgery.  Leave your suitcase in the car. After surgery it may be brought to your room.  For patients admitted to the hospital, discharge time is determined by your treatment team.   Patients discharged the day of surgery will not be allowed to drive home.    Please read over the following fact sheets that you were given:   [facility] Preparing for Surgery and or MRSA Information   _x___ Take these medicines the morning of surgery with A SIP OF WATER:    1. atenolol (TENORMIN) 100 MG tablet  2.simvastatin (ZOCOR) 40 MG tablet  3.  4.  5.  6.  ____ Fleet Enema (as directed)   _x___ Use CHG Soap or sage wipes as directed on instruction sheet   ____ Use inhalers on the day of surgery and bring to hospital day of  surgery  ____ Stop metformin 2 days prior to surgery    ____ Take 1/2 of usual insulin dose the night before surgery and none on the morning of           surgery.   __x__ Stop aspirin or coumadin, or plavix Stop Aspirin 1 week before surgery  _x__ Stop Anti-inflammatories such as Advil, Aleve, Ibuprofen, Motrin, Naproxen,          Naprosyn, Goodies powders or aspirin products. Ok to take Tylenol.   __x__ Stop supplements until after surgery.  Stop Omega 3 1 week before surgery  ____ Bring C-Pap to the hospital.

## 2016-07-06 ENCOUNTER — Telehealth: Payer: Self-pay | Admitting: Radiology

## 2016-07-06 NOTE — Telephone Encounter (Signed)
LMOM to notify pt to hold ASA 81mg  beginning 07/11/16 prior to surgery scheduled 07/18/16.

## 2016-07-07 NOTE — Telephone Encounter (Signed)
Spoke with son, Jacob Sellers, regarding holding ASA 81mg  beginning 07/11/16 & resuming after surgery. Jacob Sellers voices understanding.

## 2016-07-18 ENCOUNTER — Ambulatory Visit: Payer: Medicare Other | Admitting: Certified Registered"

## 2016-07-18 ENCOUNTER — Encounter: Payer: Self-pay | Admitting: *Deleted

## 2016-07-18 ENCOUNTER — Encounter
Admission: RE | Disposition: A | Discharge status: Home / Self Care | Payer: Self-pay | Source: Ambulatory Visit | Attending: Urology

## 2016-07-18 ENCOUNTER — Ambulatory Visit
Admission: RE | Admit: 2016-07-18 | Discharge: 2016-07-18 | Disposition: A | Discharge status: Home / Self Care | Payer: Medicare Other | Source: Ambulatory Visit | Attending: Urology | Admitting: Urology

## 2016-07-18 DIAGNOSIS — N358 Other urethral stricture: Secondary | ICD-10-CM | POA: Insufficient documentation

## 2016-07-18 DIAGNOSIS — N359 Urethral stricture, unspecified: Secondary | ICD-10-CM

## 2016-07-18 DIAGNOSIS — E785 Hyperlipidemia, unspecified: Secondary | ICD-10-CM | POA: Insufficient documentation

## 2016-07-18 DIAGNOSIS — Z87891 Personal history of nicotine dependence: Secondary | ICD-10-CM | POA: Insufficient documentation

## 2016-07-18 DIAGNOSIS — M1990 Unspecified osteoarthritis, unspecified site: Secondary | ICD-10-CM | POA: Diagnosis not present

## 2016-07-18 DIAGNOSIS — N99113 Postprocedural anterior urethral stricture: Secondary | ICD-10-CM | POA: Diagnosis not present

## 2016-07-18 DIAGNOSIS — B952 Enterococcus as the cause of diseases classified elsewhere: Secondary | ICD-10-CM | POA: Insufficient documentation

## 2016-07-18 DIAGNOSIS — N390 Urinary tract infection, site not specified: Secondary | ICD-10-CM | POA: Insufficient documentation

## 2016-07-18 DIAGNOSIS — I10 Essential (primary) hypertension: Secondary | ICD-10-CM | POA: Diagnosis not present

## 2016-07-18 HISTORY — PX: CYSTOSCOPY WITH URETHRAL DILATATION: SHX5125

## 2016-07-18 LAB — POCT I-STAT 4, (NA,K, GLUC, HGB,HCT)
GLUCOSE: 112 mg/dL — AB (ref 65–99)
HEMATOCRIT: 46 % (ref 39.0–52.0)
Hemoglobin: 15.6 g/dL (ref 13.0–17.0)
Potassium: 4.4 mmol/L (ref 3.5–5.1)
Sodium: 140 mmol/L (ref 135–145)

## 2016-07-18 SURGERY — CYSTOSCOPY, WITH URETHRAL DILATION
Anesthesia: General | Wound class: Clean Contaminated

## 2016-07-18 MED ORDER — ONDANSETRON HCL 4 MG/2ML IJ SOLN
INTRAMUSCULAR | Status: DC | PRN
  Administered 2016-07-18: 4 mg via INTRAVENOUS

## 2016-07-18 MED ORDER — FENTANYL CITRATE (PF) 100 MCG/2ML IJ SOLN
INTRAMUSCULAR | Status: AC
  Administered 2016-07-18: 25 ug via INTRAVENOUS
  Filled 2016-07-18: qty 2

## 2016-07-18 MED ORDER — LIDOCAINE HCL (CARDIAC) 20 MG/ML IV SOLN
INTRAVENOUS | Status: DC | PRN
  Administered 2016-07-18: 100 mg via INTRAVENOUS

## 2016-07-18 MED ORDER — FENTANYL CITRATE (PF) 100 MCG/2ML IJ SOLN
25.0000 ug | INTRAMUSCULAR | Status: DC | PRN
  Administered 2016-07-18 (×4): 25 ug via INTRAVENOUS

## 2016-07-18 MED ORDER — FAMOTIDINE 20 MG PO TABS
20.0000 mg | ORAL_TABLET | Freq: Once | ORAL | Status: AC
  Administered 2016-07-18: 20 mg via ORAL

## 2016-07-18 MED ORDER — HYDROCODONE-ACETAMINOPHEN 5-325 MG PO TABS: 1.0000 | ORAL_TABLET | Freq: Four times a day (QID) | ORAL | 0 refills | Status: DC | PRN

## 2016-07-18 MED ORDER — CEFAZOLIN SODIUM-DEXTROSE 2-4 GM/100ML-% IV SOLN
INTRAVENOUS | Status: AC
  Filled 2016-07-18: qty 100

## 2016-07-18 MED ORDER — CEFAZOLIN SODIUM-DEXTROSE 2-4 GM/100ML-% IV SOLN
2.0000 g | INTRAVENOUS | Status: AC
  Administered 2016-07-18: 2 g via INTRAVENOUS

## 2016-07-18 MED ORDER — LACTATED RINGERS IV SOLN
INTRAVENOUS | Status: DC
  Administered 2016-07-18: 08:00:00 via INTRAVENOUS

## 2016-07-18 MED ORDER — FAMOTIDINE 20 MG PO TABS
ORAL_TABLET | ORAL | Status: AC
  Filled 2016-07-18: qty 1

## 2016-07-18 MED ORDER — ONDANSETRON HCL 4 MG/2ML IJ SOLN: 4.0000 mg | Freq: Once | INTRAMUSCULAR | Status: DC | PRN

## 2016-07-18 MED ORDER — FENTANYL CITRATE (PF) 100 MCG/2ML IJ SOLN
INTRAMUSCULAR | Status: DC | PRN
  Administered 2016-07-18: 25 ug via INTRAVENOUS
  Administered 2016-07-18: 50 ug via INTRAVENOUS

## 2016-07-18 MED ORDER — PROPOFOL 10 MG/ML IV BOLUS
INTRAVENOUS | Status: DC | PRN
  Administered 2016-07-18: 180 mg via INTRAVENOUS

## 2016-07-18 SURGICAL SUPPLY — 19 items
CATH FOL 2WAY LX 16X5 (CATHETERS) IMPLANT
CATH FOLEY 2W COUNCIL 5CC 18FR (CATHETERS) ×3 IMPLANT
CATH URETHRAL DIL 7.0X29 (CATHETERS) ×3 IMPLANT
ELECT REM PT RETURN 9FT ADLT (ELECTROSURGICAL)
ELECTRODE REM PT RTRN 9FT ADLT (ELECTROSURGICAL) IMPLANT
GLOVE BIO SURGEON STRL SZ 6.5 (GLOVE) ×2 IMPLANT
GLOVE BIO SURGEON STRL SZ7 (GLOVE) ×6 IMPLANT
GLOVE BIO SURGEONS STRL SZ 6.5 (GLOVE) ×1
GOWN STRL REUS W/ TWL LRG LVL3 (GOWN DISPOSABLE) ×2 IMPLANT
GOWN STRL REUS W/TWL LRG LVL3 (GOWN DISPOSABLE) ×4
PACK CYSTO AR (MISCELLANEOUS) ×3 IMPLANT
PREP PVP WINGED SPONGE (MISCELLANEOUS) IMPLANT
SENSORWIRE 0.038 NOT ANGLED (WIRE) ×3
SET CYSTO W/LG BORE CLAMP LF (SET/KITS/TRAYS/PACK) ×3 IMPLANT
SYR 30ML LL (SYRINGE) ×3 IMPLANT
SYRINGE 10CC LL (SYRINGE) ×3 IMPLANT
WATER STERILE IRR 1000ML POUR (IV SOLUTION) ×3 IMPLANT
WATER STERILE IRR 3000ML UROMA (IV SOLUTION) ×3 IMPLANT
WIRE SENSOR 0.038 NOT ANGLED (WIRE) ×1 IMPLANT

## 2016-07-18 NOTE — Op Note (Signed)
Date of procedure: 07/18/16  Preoperative diagnosis:  1. Bulbar urethral stricture  Postoperative diagnosis:  1. Same as above   Procedure: 1. Cystoscopy 2. Urethral dilation  Surgeon: Vanna ScotlandAshley Lionardo Haze, MD  Anesthesia: General  Complications: None  Intraoperative findings: ~6 Fr bulbar urethral stricture.  Trabeculated bladder  EBL: miniaml  Specimens: urine culture  Drains: 18 Fr Foley  Indication: Jacob Sellers is a 70 y.o. patient with lower urinary tract symptoms found to have an approximately 6 French bulbar urethral stricture.  After reviewing the management options for treatment, he elected to proceed with the above surgical procedure(s). We have discussed the potential benefits and risks of the procedure, side effects of the proposed treatment, the likelihood of the patient achieving the goals of the procedure, and any potential problems that might occur during the procedure or recuperation. Informed consent has been obtained.  Description of procedure:  The patient was taken to the operating room and general anesthesia was induced.  The patient was placed in the dorsal lithotomy position, prepped and draped in the usual sterile fashion, and preoperative antibiotics were administered. A preoperative time-out was performed.   A rigid 21 French cystoscope was advanced per urethra to the bulb of the urethra where an approximately 6 Fr urethral stricture was encountered. A sensor wire was then placed under direct visualization beyond the strictured area which went easily. It appeared for the most part that the stricture was fairly soft and short in length as I was able to see just beyond this area. Next, a Cook urethral balloon dilator was advanced over the wire beyond the strictured area such that the balloon traversed the stricture. The balloon was then filled with 7 cc of water and allowed to remain in place for proximally 5 minutes. Once deflated, there was a scant amount of  blood noted. The wire was left in place and the balloon was removed after being deflated. The 21 French cystoscope was then able to be advanced alongside the wire beyond the area of stricture into the prostatic urethra and the bladder itself. 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 The trigone was normal with clear reflux of urine from both. There are no bladder lesions or tumors appreciated. No bladder stones. A sample was taken for urine culture as this was not performed preop although the patient was asymptomatic. Finally, the scope was removed and a 18 [place] council tip catheter was advanced over the wire to the bladder. The balloon was filled with 10 cc of sterile water. The wire was then withdrawn and the catheter drained appropriately. The catheter was secured to the patient's left leg using a catheter secure. He was then cleaned and dried, repositioned supine position, reversed from anesthesia, taken to the PACU in stable condition.  Plan: Patient will follow up next week for Foley catheter removal. He'll be offered CIC teaching to be performed once a day to keep his bulbar urethral stricture open.  Vanna ScotlandAshley Jacob Sellers, M.D.

## 2016-07-18 NOTE — Interval H&P Note (Signed)
History and Physical Interval Note:  07/18/2016 8:09 AM  Jacob Sellers  has presented today for surgery, with the diagnosis of urethral stone  The various methods of treatment have been discussed with the patient and family. After consideration of risks, benefits and other options for treatment, the patient has consented to  Procedure(s): CYSTOSCOPY WITH URETHRAL DILATATION WITH FOLEY CATHETER PLACEMENT (N/A) as a surgical intervention .  The patient's history has been reviewed, patient examined, no change in status, stable for surgery.  I have reviewed the patient's chart and labs.  Questions were answered to the patient's satisfaction.    RRR CTAB  Vanna ScotlandAshley Sasan Wilkie

## 2016-07-18 NOTE — Anesthesia Procedure Notes (Signed)
Procedure Name: LMA Insertion Performed by: Tiberius Loftus Pre-anesthesia Checklist: Patient identified, Patient being monitored, Timeout performed, Emergency Drugs available and Suction available Patient Re-evaluated:Patient Re-evaluated prior to inductionOxygen Delivery Method: Circle system utilized Preoxygenation: Pre-oxygenation with 100% oxygen Intubation Type: IV induction LMA: LMA inserted LMA Size: 4.0 Tube type: Oral Number of attempts: 2 Placement Confirmation: positive ETCO2 and breath sounds checked- equal and bilateral Tube secured with: Tape Dental Injury: Teeth and Oropharynx as per pre-operative assessment        

## 2016-07-18 NOTE — Transfer of Care (Signed)
Immediate Anesthesia Transfer of Care Note  Patient: Jacob Sellers  Procedure(s) Performed: Procedure(s): CYSTOSCOPY WITH URETHRAL DILATATION WITH FOLEY CATHETER PLACEMENT (N/A)  Patient Location: PACU  Anesthesia Type:General  Level of Consciousness: sedated  Airway & Oxygen Therapy: Patient Spontanous Breathing and Patient connected to face mask oxygen  Post-op Assessment: Report given to RN and Post -op Vital signs reviewed and stable  Post vital signs: Reviewed and stable  Last Vitals:  Vitals:   07/18/16 0855 07/18/16 0858  BP: (!) 132/94 (!) 132/94  Pulse: (!) 44 (!) 46  Resp: 10 10  Temp: 36.7 C     Last Pain:  Vitals:   07/18/16 0803  TempSrc: Tympanic         Complications: No apparent anesthesia complications

## 2016-07-18 NOTE — Progress Notes (Signed)
Instructions gone over with family and interpretor   States understanding

## 2016-07-18 NOTE — Anesthesia Postprocedure Evaluation (Signed)
Anesthesia Post Note  Patient: Jacob Sellers  Procedure(s) Performed: Procedure(s) (LRB): CYSTOSCOPY WITH URETHRAL DILATATION WITH FOLEY CATHETER PLACEMENT (N/A)  Patient location during evaluation: PACU Anesthesia Type: General Level of consciousness: awake and alert Pain management: pain level controlled Vital Signs Assessment: post-procedure vital signs reviewed and stable Respiratory status: spontaneous breathing, nonlabored ventilation, respiratory function stable and patient connected to nasal cannula oxygen Cardiovascular status: blood pressure returned to baseline and stable Postop Assessment: no signs of nausea or vomiting Anesthetic complications: no    Last Vitals:  Vitals:   07/18/16 0949 07/18/16 1000  BP: (!) 171/91 (!) 179/93  Pulse: (!) 50 (!) 52  Resp: 16   Temp: (!) 35.9 C     Last Pain:  Vitals:   07/18/16 0949  TempSrc: Tympanic  PainSc:                  Yevette EdwardsJames G Adams

## 2016-07-18 NOTE — Discharge Instructions (Signed)
Cuidados del catter Foley - Adultos (Foley Catheter Care, Adult)  Un catter Foley es un tubo flexible y blando. Este catter se coloca en la vejiga para drenar el pis (orina). Si le dan el alta con este catter colocado, siga las siguientes instrucciones. CUIDADOS DEL CATTER:  1. Lave sus manos con agua y [place]. 2. Use un jabn suave y agua tibia en un pao limpio.  Limpie la piel donde el tubo entra en su cuerpo.  Limpie hacia afuera del sitio de la sonda.  Nunca limpie hacia el tubo.  Higienice la zona con un movimiento circular.  Quite todo el jabn. Seque el rea con una toalla limpia dando golpecitos. Los Advertising copywriter en su lugar la piel que cubre el extremo del pene (prepucio). 3. Una la bolsa a la pierna con cinta adhesiva o con una correa para la pierna. No estire el tubo. Si [place] una cinta [place], elimine el residuo pegajoso que haya dejado la cinta anterior. 4. Mantenga la bolsa de drenaje por debajo de la cadera. No deje que toque el suelo. 5. Durante el da controle el tubo. Asegrese de que funciona y drena. Asegrese de que el tubo no se doble, retuerza o acode. 6. No tire del tubo ni trate de sacarlo. CUIDADOS DE LA BOLSA DE DRENAJE  Tendr una bolsa de drenaje grande durante la noche y Ardelia Mems bolsa pequea para la pierna. Puede usar la bolsa para la noche en cualquier momento. Nunca use la bolsa pequea en la noche. Siga las indicaciones que encontrar a continuacin. Vaciado de la bolsa de drenaje  Debe vaciar la bolsa de drenaje cuando est a  -  completa o al menos 2 a 3 veces al [employer].  1. Lave sus manos con agua y [place]. 2. Mantenga la bolsa de drenaje por debajo de la cadera. 3. Sostenga la bolsa sucia sobre el inodoro o un recipiente limpio. 4. Sherlon Handing el pico vertedor en la parte inferior de la bolsa. Vace la orina en el inodoro o el recipiente. No deje que el pico vertedor toque nada. 5. Limpie el pico vertedor con una gasa o algodn con  alcohol. 6. Cierre Chartered certified accountant. 7. Ardelia Mems la bolsa a la pierna con cinta adhesiva o con una correa para la pierna. 8. Lvese bien las manos. Cambio de la bolsa de drenaje Cambie la bolsa de drenaje una vez al mes o antes si empieza a oler mal o la ve sucia.  1. Lave sus manos con agua y [place]. 2. Comprima el catter de goma para que la orina no se derrame. 3. Desconecte el tubo del catter del tubo de drenaje en la conexin de la vlvula. No deje que los tubos toquen nada. 4. Limpie el extremo del catter con un hisopo con alcohol. Limpie el extremo de la bolsa de drenaje con otro hisopo con alcohol. 5. Conecte el tubo del catter al tubo de drenaje de la bolsa limpia. 6. Adhiera la nueva bolsa a la pierna con cinta adhesiva o un broche. Evite ajustar mucho la nueva bolsa. 7. Lvese bien las manos. Limpieza de la bolsa de drenaje 1. Lave sus manos con agua y [place]. 2. Stacy Gardner la bolsa con agua tibia jabonosa. 3. Enjuague la bolsa con agua caliente. 4. Llene la bolsa con una solucin de vinagre blanco y agua (1 taza de vinagre y 950 cc de agua caliente [200 cc vinagre en 1 L de agua tibia]). Cierre la bolsa y djela en remojo durante 30  minutos con la solucin. 5. Enjuague la bolsa con agua caliente. 6. Cuelgue la bolsa para que se seque con el pico vertedor abierto y CBS Corporationcolgando hacia abajo. 7. Guarde la bolsa limpia (una vez seca) y Lucile Shuttersuna bolsa de plstico limpia. 8. Lvese bien las manos. EVITE INFECCIONES   Lvese bien las manos antes y despus de tocar el tubo.  Dchese CarMaxtodos los das. Lave la piel donde el tubo entra en el cuerpo.  No tome baos de inmersin. Reemplace las correas mojadas por correas secas, si corresponde.  No use talcos, aerosoles o lociones en el rea genital. Slo use cremas, lociones o ungentos como le indique su mdico.  Las mujeres deben higienizarse de adelante hacia atrs despus de ir al bao.  Beba suficiente lquido para Photographermantener la orina clara o de color  amarillo plido, excepto si tiene restriccin de lquidos.  No deje que la bolsa de drenaje ni los tubos toquen el suelo.  Use ropa interior de algodn para Radio producermantener el rea seca. SOLICITE AYUDA SI:  La orina es turbia o huele mal.  El catter se obstruye.  No drena orina hacia la bolsa o siente la vejiga llena.  El catter comienza a perder. SOLICITE AYUDA DE INMEDIATO SI:   Tiene dolor, inflamacin (hinchazn), enrojecimiento u observa un lquido de color blanco amarillento (pus) en el sitio donde el tubo entra en el cuerpo.  Siente dolor en el vientre (abdomen), las piernas, la cintura o la vejiga.  Tiene fiebre.  Observa sangre en el catter o la orina es de color rosado o roja.  Tiene malestar estomacal (nuseas), vmitos o tiene escalofros.  El Sports administratorcatter se sale. ASEGRESE DE QUE:   Comprende estas instrucciones.  Controlar su enfermedad.  Solicitar ayuda de inmediato si no mejora o si empeora.   Esta informacin no tiene Theme park managercomo fin reemplazar el consejo del mdico. Asegrese de hacerle al mdico cualquier pregunta que tenga.   Document Released: 02/18/2013 Document Revised: 11/14/2014 Elsevier Interactive Patient Education 2016 [employer]   The HideoutAnestesia general, adultos, cuidados posteriores (General Anesthesia, Adult, Care After) Siga estas instrucciones durante las prximas semanas. Estas indicaciones le proporcionan informacin acerca de cmo deber cuidarse despus del procedimiento. El mdico tambin podr darle instrucciones ms especficas. El tratamiento se ha planificado de acuerdo a las prcticas mdicas actuales, pero a veces se producen problemas. Comunquese con el mdico si tiene algn problema o tiene dudas despus del procedimiento. QU ESPERAR DESPUS DEL PROCEDIMIENTO Despus del procedimiento es habitual experimentar:  Somnolencia.  Nuseas y vmitos. INSTRUCCIONES PARA EL CUIDADO EN EL HOGAR  Durante las primeras 24 horas luego de la  anestesia general:  Haga que una persona responsable se quede con usted.  No conduzca un automvil. Si est solo, no viaje en transporte pblico.  No beba alcohol.  No tome medicamentos que no le haya recetado su mdico.  No firme documentos importantes ni tome decisiones trascendentes.  Puede reanudar su dieta y sus actividades normales segn le haya indicado el mdico.  Cambie los vendajes (apsitos) tal como se le indic.  Si tiene preguntas o se le presenta algn problema relacionado con la anestesia general, comunquese con el hospital y pida por el anestesista o anestesilogo de [place]. SOLICITE ATENCIN MDICA SI:  Tiene nuseas y vmitos durante el da posterior a la anestesia.  Le aparece una erupcin cutnea. SOLICITE ATENCIN MDICA DE INMEDIATO SI:   Tiene dificultad para respirar.  Siente dolor en el pecho.  Tiene algn problema alrgico.  Tiene nuevos problemas para controlar la vejiga o los intestinos.  · Siente debilidad o adormecimiento inusuales en los brazos o en las piernas.  · Siente náuseas o vómitos.  · Siente dolor abdominal.  · Siente que va a desmayarse.     Esta información no tiene como fin reemplazar el consejo del médico. Asegúrese de hacerle al médico cualquier pregunta que tenga.     Document Released: 10/24/2005 Document Revised: 11/14/2014  Elsevier Interactive Patient Education ©2016 Elsevier Inc.

## 2016-07-18 NOTE — H&P (View-Only) (Signed)
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  His IPSS score is 28, which is severe lower urinary tract symptomatology. He is unhappy with his quality life due to his urinary symptoms. His PVR was 122 mL.   His major complaint is frequency, nocturia, intermittency, hesitancy, straining to urinate and a weak urinary stream.  He denies any dysuria, hematuria or suprapubic pain.   He currently taking finasteride 5 mg daily and doxazosin 4 mg daily.  He also denies any recent fevers, chills, nausea or vomiting.  He does not have a family history of PCa. PSA Jul 2017 was  0.8.   Today, Jacob Sellers is seen for cystoscopy and to discuss his options depending on the findings. 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    Past Medical History:  Diagnosis Date  . Arthritis   . Heartburn arthritis  . HLD (hyperlipidemia)   . Hypertension    No past surgical history on file.  Home Medications:   (Not in a hospital admission) Allergies: No Known Allergies  Family History  Problem Relation Age of Onset  . Prostate cancer Neg Hx   . Kidney disease Neg Hx    Social History:  reports that he has quit smoking. He has never used smokeless tobacco. He reports that he drinks alcohol. His drug history is not on file.  ROS: A complete review of systems was performed.  All systems are negative except for pertinent findings as noted. ROS   Procedure - Cystoscopy: after consent was obtained the patient was placed supine and prepped and draped in the usual sterile fashion. The cystoscope was inserted per urethra which was inspected and the scope was withdrawn. The patient tolerated the procedure well. We are using the digital scope with the monitor today, so the patient was able to see the anatomy.  Findings: Severe stricture of the bulbar urethra, not manipulated.  Laboratory Data:  No results found for this or any  previous visit (from the past 24 hour(s)). No results found for this or any previous visit (from the past 240 hour(s)). Creatinine: No results for input(s): CREATININE in the last 168 hours.  Impression/Assessment/plan:  Urethral stricture with LUTS - I discussed with the patient and the interpreter. I drew him a picture the anatomy. We discussed we are not certain of the length. We discussed the nature risks and benefits of surveillance, balloon dilation or formal urethroplasty per AUA Guideline. All questions answered. Because the patient has had symptoms for several years he does not desire continued surveillance. He elected to proceed with balloon dilation and we did discuss risks of infection, bleeding and incontinence among others as well as the likelihood of recurrence. All questions answered. We did discuss referral to specialist one of the universities. Again he would like to proceed with dilation first. We discussed we might consider CIC postoperatively. I discussed with him one of my colleagues would likely be doing the procedure.   Jacob Sellers 06/27/2016, 2:37 PM  

## 2016-07-18 NOTE — Anesthesia Preprocedure Evaluation (Signed)
Anesthesia Evaluation  Patient identified by MRN, date of birth, ID band Patient awake    Reviewed: Allergy & Precautions, H&P , NPO status , Patient's Chart, lab work & pertinent test results, reviewed documented beta blocker date and time   Airway Mallampati: II  TM Distance: >3 FB Neck ROM: full    Dental  (+) Teeth Intact   Pulmonary neg pulmonary ROS, former smoker,    Pulmonary exam normal        Cardiovascular Exercise Tolerance: Good hypertension, negative cardio ROS Normal cardiovascular exam Rate:Normal     Neuro/Psych negative neurological ROS  negative psych ROS   GI/Hepatic negative GI ROS, Neg liver ROS,   Endo/Other  negative endocrine ROS  Renal/GU negative Renal ROS  negative genitourinary   Musculoskeletal   Abdominal   Peds  Hematology negative hematology ROS (+)   Anesthesia Other Findings   Reproductive/Obstetrics negative OB ROS                             Anesthesia Physical Anesthesia Plan  ASA: III  Anesthesia Plan: General LMA   Post-op Pain Management:    Induction:   Airway Management Planned:   Additional Equipment:   Intra-op Plan:   Post-operative Plan:   Informed Consent: I have reviewed the patients History and Physical, chart, labs and discussed the procedure including the risks, benefits and alternatives for the proposed anesthesia with the patient or authorized representative who has indicated his/her understanding and acceptance.     Plan Discussed with: CRNA  Anesthesia Plan Comments:         Anesthesia Quick Evaluation

## 2016-07-20 ENCOUNTER — Telehealth: Payer: Self-pay

## 2016-07-20 DIAGNOSIS — N390 Urinary tract infection, site not specified: Secondary | ICD-10-CM

## 2016-07-20 LAB — URINE CULTURE: Culture: >=100000 — AB

## 2016-07-20 MED ORDER — LEVOFLOXACIN 500 MG PO TABS: 500.0000 mg | ORAL_TABLET | Freq: Every day | ORAL | 0 refills | Status: DC

## 2016-07-20 NOTE — Telephone Encounter (Signed)
Spoke with patient through language line [phone]  A detailed message was left on patient's voicemail through the interpreter notifying him of the infection and that the abx were sent to his local pharm, he is to call with any questions or concerns.

## 2016-07-20 NOTE — Telephone Encounter (Signed)
-----   Message from Vanna ScotlandAshley Brandon, MD sent at 07/20/2016 10:32 AM EDT ----- Please let this patient know that we sent a urine culture from the operating room which ultimately grew enterococcus consistent with a UTI. Please treat him with Levaquin 500 mg daily for 1 week.  Vanna ScotlandAshley Brandon, MD

## 2016-07-21 ENCOUNTER — Encounter: Payer: Self-pay | Admitting: Urology

## 2016-07-25 ENCOUNTER — Ambulatory Visit (INDEPENDENT_AMBULATORY_CARE_PROVIDER_SITE_OTHER): Payer: Medicare Other | Admitting: Urology

## 2016-07-25 ENCOUNTER — Encounter: Payer: Self-pay | Admitting: Urology

## 2016-07-25 DIAGNOSIS — N99111 Postprocedural bulbous urethral stricture: Secondary | ICD-10-CM | POA: Diagnosis not present

## 2016-07-25 DIAGNOSIS — Z125 Encounter for screening for malignant neoplasm of prostate: Secondary | ICD-10-CM | POA: Insufficient documentation

## 2016-07-25 DIAGNOSIS — N400 Enlarged prostate without lower urinary tract symptoms: Secondary | ICD-10-CM

## 2016-07-25 DIAGNOSIS — N35919 Unspecified urethral stricture, male, unspecified site: Secondary | ICD-10-CM | POA: Insufficient documentation

## 2016-07-25 NOTE — Progress Notes (Signed)
Fill and Pull Catheter Removal  Patient is present today for a catheter removal.  Patient was cleaned and prepped in a sterile fashion 120ml of sterile water/ saline was instilled into the bladder when the patient felt the urge to urinate. 10ml of water was then drained from the balloon.  A 18FR foley cath was removed from the bladder no complications were noted .  Patient as then given some time to void on their own.  Patient had a bladder a spasm and patient voided urine at that time, unable to measure.  Patient tolerated well.  Preformed by: Maralyn SagoSarah Kristalyn Bergstresser, CMA

## 2016-07-25 NOTE — Progress Notes (Signed)
07/25/2016 7:19 AM   Jacob OvermanEmiliano Harvie HeckValdes August 04, 1946 [id number]  Referring provider: Sandrea HughsJessica Rubio, NP [address] [place], [place] [id number]  No chief complaint on file.   HPI:  1 - Bulbar Urethral Stricture - s/p dilation 07/2016 of bulbar stricture found on eval retractory LUTS. PVR pre-dilation <12950mL.  2 - Enlarged Prostate - on finasteride + doxazosin per PCP at baseline for enlarged prostate.   3 - Prostate Screening - PSA 0.8 (on finasteride) age 70 2017.  Today "Jacob Overmanmiliano" is seen for trial of void. He is 1 week s/p operative urethral dilation for short segments high grade bulbar stricture.    PMH: Past Medical History:  Diagnosis Date  . Arthritis   . Heartburn arthritis  . HLD (hyperlipidemia)   . Hypertension   . Prostate enlargement     Surgical History: Past Surgical History:  Procedure Laterality Date  . cyst removed Rt thigh Right   . CYSTOSCOPY WITH URETHRAL DILATATION N/A 07/18/2016   Procedure: CYSTOSCOPY WITH URETHRAL DILATATION WITH FOLEY CATHETER PLACEMENT;  Surgeon: Vanna ScotlandAshley Brandon, MD;  Location: [facility] ORS;  Service: Urology;  Laterality: N/A;    Home Medications:    Medication List       Accurate as of 07/25/16  7:19 AM. Always use your most recent med list.          aspirin EC 81 MG tablet Take 81 mg by mouth daily.   atenolol 100 MG tablet Commonly known as:  TENORMIN Take 100 mg by mouth every morning.   doxazosin 4 MG tablet Commonly known as:  CARDURA Take 4 mg by mouth daily. afternoon   finasteride 5 MG tablet Commonly known as:  PROSCAR   hydrochlorothiazide 25 MG tablet Commonly known as:  HYDRODIURIL   HYDROcodone-acetaminophen 5-325 MG tablet Commonly known as:  NORCO/VICODIN Take 1-2 tablets by mouth every 6 (six) hours as needed for moderate pain.   levofloxacin 500 MG tablet Commonly known as:  LEVAQUIN Take 1 tablet (500 mg total) by mouth daily.   losartan 50 MG tablet Commonly known as:   COZAAR Take 50 mg by mouth daily. In afternoon   naproxen 500 MG tablet Commonly known as:  NAPROSYN   Omega 3 1000 MG Caps Take by mouth.   saw palmetto 160 MG capsule Take 160 mg by mouth 2 (two) times daily.   simvastatin 40 MG tablet Commonly known as:  ZOCOR Take 40 mg by mouth every morning.       Allergies: No Known Allergies  Family History: Family History  Problem Relation Age of Onset  . Prostate cancer Neg Hx   . Kidney disease Neg Hx     Social History:  reports that he quit smoking about 43 years ago. He has never used smokeless tobacco. He reports that he drinks about 3.6 oz of alcohol per week . He reports that he does not use drugs.   Review of Systems  Gastrointestinal (upper)  : Negative for upper GI symptoms  Gastrointestinal (lower) : Negative for lower GI symptoms  Constitutional : Negative for symptoms  Skin: Negative for skin symptoms  Eyes: Negative for eye symptoms  Ear/Nose/Throat : Negative for Ear/Nose/Throat symptoms  Hematologic/Lymphatic: Negative for Hematologic/Lymphatic symptoms  Cardiovascular : Negative for cardiovascular symptoms  Respiratory : Negative for respiratory symptoms  Endocrine: Negative for endocrine symptoms  Musculoskeletal: Negative for musculoskeletal symptoms  Neurological: Negative for neurological symptoms  Psychologic: Negative for psychiatric symptoms   Physical Exam: There were no  vitals taken for this visit.  Constitutional:  Alert and oriented, No acute distress. HEENT: [place] AT, moist mucus membranes.  Trachea midline, no masses. Cardiovascular: No clubbing, cyanosis, or edema. Respiratory: Normal respiratory effort, no increased work of breathing. GI: Abdomen is soft, nontender, nondistended, no abdominal masses GU: No CVA tenderness. Foley in situ. Skin: No rashes, bruises or suspicious lesions. Lymph: No cervical or inguinal adenopathy. Neurologic: Grossly intact, no focal  deficits, moving all 4 extremities. Psychiatric: Normal mood and affect.  Laboratory Data: Lab Results  Component Value Date   WBC 5.4 07/05/2016   HGB 15.6 07/18/2016   HCT 46.0 07/18/2016   MCV 91.6 07/05/2016   PLT 145 (L) 07/05/2016    Lab Results  Component Value Date   CREATININE 0.64 07/05/2016    No results found for: PSA  No results found for: TESTOSTERONE  No results found for: HGBA1C  Urinalysis No results found for: COLORURINE, APPEARANCEUR, LABSPEC, PHURINE, GLUCOSEU, HGBUR, BILIRUBINUR, KETONESUR, PROTEINUR, UROBILINOGEN, NITRITE, LEUKOCYTESUR  NS instilled via voley and pt voided all with removal of foley.   Assessment & Plan:    1 - Bulbar Urethral Stricture - now s/p dilation. Explained approx 50% recurrence risk and need fore repeat eval should severe obstructive symptoms return.   2 - Enlarged Prostate - continue medical therapy.   3 - Prostate Screening - no role for further screening as ag 70 and average risk.  RTC 6 mos with NP for PVR, then annual v. Prn.    No Follow-up on file.  Sebastian Ache, MD  [facility] [address], [address] [place], [place] [address] [phone]

## 2016-07-26 ENCOUNTER — Encounter: Payer: Self-pay | Admitting: Urology

## 2017-01-24 ENCOUNTER — Ambulatory Visit: Payer: Medicare Other

## 2017-02-21 ENCOUNTER — Encounter: Payer: Self-pay | Admitting: Urology

## 2017-02-21 ENCOUNTER — Ambulatory Visit (INDEPENDENT_AMBULATORY_CARE_PROVIDER_SITE_OTHER): Payer: Medicare Other | Admitting: Urology

## 2017-02-21 VITALS — BP 170/89 | HR 57 | Ht 62.25 in | Wt 215.6 lb

## 2017-02-21 DIAGNOSIS — N359 Urethral stricture, unspecified: Secondary | ICD-10-CM | POA: Diagnosis not present

## 2017-02-21 DIAGNOSIS — N400 Enlarged prostate without lower urinary tract symptoms: Secondary | ICD-10-CM | POA: Diagnosis not present

## 2017-02-21 LAB — BLADDER SCAN AMB NON-IMAGING: SCAN RESULT: 26

## 2017-02-21 NOTE — Progress Notes (Signed)
02/21/2017 11:56 AM   Jacob Sellers 1945-12-25 [id number]  Referring provider: Sandrea Hughs, NP [address] [place], [place] [address]  Chief Complaint  Patient presents with  . 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 PVR pre-dilation <161mL.  2 - Enlarged Prostate - on finasteride + doxazosin per PCP at baseline for enlarged prostate.   3 - Prostate Screening - PSA 0.8 (on finasteride) 05/2016 with a nl DRE.  Today, pt is seen for the above. He's doing well. Voiding without difficulty. No gross hematuria. He has a good flow. PVR nl today.    PMH: Past Medical History:  Diagnosis Date  . Arthritis   . Heartburn arthritis  . HLD (hyperlipidemia)   . Hypertension   . Prostate enlargement     Surgical History: Past Surgical History:  Procedure Laterality Date  . cyst removed Rt thigh Right   . CYSTOSCOPY WITH URETHRAL DILATATION N/A 07/18/2016   Procedure: CYSTOSCOPY WITH URETHRAL DILATATION WITH FOLEY CATHETER PLACEMENT;  Surgeon: [name], MD;  Location: [facility];  Service: Urology;  Laterality: N/A;    Home Medications:  Allergies as of 02/21/2017   No Known Allergies     Medication List       Accurate as of 02/21/17 11:56 AM. Always use your most recent med list.          aspirin EC 81 MG tablet Take 81 mg by mouth daily.   atenolol 100 MG tablet Commonly known as:  TENORMIN Take 100 mg by mouth every morning.   doxazosin 4 MG tablet Commonly known as:  CARDURA Take 4 mg by mouth daily. afternoon   finasteride 5 MG tablet Commonly known as:  PROSCAR   hydrochlorothiazide 25 MG tablet Commonly known as:  HYDRODIURIL   HYDROcodone-acetaminophen 5-325 MG tablet Commonly known as:  NORCO/VICODIN Take 1-2 tablets by mouth every 6 (six) hours as needed for moderate pain.   levofloxacin 500 MG tablet Commonly known as:   LEVAQUIN Take 1 tablet (500 mg total) by mouth daily.   losartan 50 MG tablet Commonly known as:  COZAAR Take 50 mg by mouth daily. In afternoon   naproxen 500 MG tablet Commonly known as:  NAPROSYN   Omega 3 1000 MG Caps Take by mouth.   saw palmetto 160 MG capsule Take 160 mg by mouth 2 (two) times daily.   simvastatin 40 MG tablet Commonly known as:  ZOCOR Take 40 mg by mouth every morning.       Allergies: No Known Allergies  Family History: Family History  Problem Relation Age of Onset  . Prostate cancer Neg Hx   . Kidney disease Neg Hx     Social History:  reports that he quit smoking about 43 years ago. He has never used smokeless tobacco. He reports that he drinks about 3.6 oz of alcohol per week . He reports that he does not use drugs.  ROS: UROLOGY Frequent Urination?: No Hard to postpone urination?: No Burning/pain with urination?: No Get up at night to urinate?: Yes Leakage of urine?: No Urine stream starts and stops?: No Trouble starting stream?: No Do you have to strain to urinate?: No Blood in urine?: No Urinary tract infection?: No Sexually transmitted disease?: No Injury to kidneys or bladder?: No Painful intercourse?: No Weak stream?: No Erection problems?: No Penile pain?: No  Gastrointestinal Nausea?: No Vomiting?: No Indigestion/heartburn?:  No Diarrhea?: No Constipation?: No  Constitutional Fever: No Night sweats?: No Weight loss?: No Fatigue?: No  Skin Skin rash/lesions?: No Itching?: No  Eyes Blurred vision?: No Double vision?: No  Ears/Nose/Throat Sore throat?: No Sinus problems?: No  Hematologic/Lymphatic Swollen glands?: No Easy bruising?: No  Cardiovascular Leg swelling?: No Chest pain?: No  Respiratory Cough?: No Shortness of breath?: No  Endocrine Excessive thirst?: No  Musculoskeletal Back pain?: No Joint pain?: No  Neurological Headaches?: No Dizziness?: No  Psychologic Depression?:  No Anxiety?: No  Physical Exam: BP (!) 170/89   Pulse (!) 57   Ht 5' 2.25" (1.581 m)   Wt 97.8 kg (215 lb 9.6 oz)   BMI 39.12 kg/m   Constitutional:  Alert and oriented, No acute distress. HEENT: [place] AT, moist mucus membranes.  Trachea midline, no masses. Cardiovascular: No clubbing, cyanosis, or edema. Respiratory: Normal respiratory effort, no increased work of breathing. GI: Abdomen is soft, nontender, nondistended, no abdominal masses GU: No CVA tenderness. Skin: No rashes, bruises or suspicious lesions. Lymph: No cervical or inguinal adenopathy. Neurologic: Grossly intact, no focal deficits, moving all 4 extremities. Psychiatric: Normal mood and affect.  Laboratory Data: Lab Results  Component Value Date   WBC 5.4 07/05/2016   HGB 15.6 07/18/2016   HCT 46.0 07/18/2016   MCV 91.6 07/05/2016   PLT 145 (L) 07/05/2016    Lab Results  Component Value Date   CREATININE 0.64 07/05/2016    No results found for: PSA  No results found for: TESTOSTERONE  No results found for: HGBA1C  Urinalysis No results found for: COLORURINE, APPEARANCEUR, LABSPEC, PHURINE, GLUCOSEU, HGBUR, BILIRUBINUR, KETONESUR, PROTEINUR, UROBILINOGEN, NITRITE, LEUKOCYTESUR   Assessment & Plan:   1) bph - stable on combination therapy   2) urethral sx - stable.   See in 1 year for UA, PVR and DRE.   There are no diagnoses linked to this encounter.  No Follow-up on file.  Jerilee Field, MD  [facility] [address] [place], [place] [address] [phone]

## 2018-02-22 ENCOUNTER — Encounter: Payer: Self-pay | Admitting: Urology

## 2018-02-22 ENCOUNTER — Ambulatory Visit (INDEPENDENT_AMBULATORY_CARE_PROVIDER_SITE_OTHER): Payer: Medicare Other | Admitting: Urology

## 2018-02-22 VITALS — BP 174/88 | HR 62 | Resp 16 | Ht 63.0 in | Wt 214.9 lb

## 2018-02-22 DIAGNOSIS — N401 Enlarged prostate with lower urinary tract symptoms: Secondary | ICD-10-CM

## 2018-02-22 DIAGNOSIS — R3914 Feeling of incomplete bladder emptying: Secondary | ICD-10-CM

## 2018-02-22 DIAGNOSIS — N400 Enlarged prostate without lower urinary tract symptoms: Secondary | ICD-10-CM | POA: Diagnosis not present

## 2018-02-22 DIAGNOSIS — Z125 Encounter for screening for malignant neoplasm of prostate: Secondary | ICD-10-CM | POA: Diagnosis not present

## 2018-02-22 LAB — MICROSCOPIC EXAMINATION: Epithelial Cells (non renal): NONE SEEN /hpf (ref 0–10)

## 2018-02-22 LAB — URINALYSIS, COMPLETE
Bilirubin, UA: NEGATIVE
GLUCOSE, UA: NEGATIVE
Ketones, UA: NEGATIVE
NITRITE UA: NEGATIVE
Protein, UA: NEGATIVE
Specific Gravity, UA: 1.02 (ref 1.005–1.030)
Urobilinogen, Ur: 0.2 mg/dL (ref 0.2–1.0)
pH, UA: 6 (ref 5.0–7.5)

## 2018-02-22 LAB — BLADDER SCAN AMB NON-IMAGING

## 2018-02-22 NOTE — Progress Notes (Signed)
02/22/2018 10:16 AM   Jacob Sellers 27-Jan-1946 [id number]  Referring provider: Sandrea Hughsubio, Jessica, NP [address] [place], [place] [id number]  Chief Complaint  Patient presents with  . 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 PVR pre-dilation <13950mL.  2 - Enlarged Prostate - on finasteride + doxazosin per PCP at baseline for enlarged prostate.   3 - Prostate Screening - PSA 0.8 (on finasteride) 05/2016 with a nl DRE.  Interval: The patient was last seen 1 year ago.  He denies any progression of his urinary tract symptoms he denies any irritative voiding symptoms including urgency or dysuria.  He does feels if he empties his bladder incompletely.  However, this is baseline.  The patient otherwise is doing quite well.  He is not taking any medications for his prostate at this time.   PMH: Past Medical History:  Diagnosis Date  . Arthritis   . Heartburn arthritis  . HLD (hyperlipidemia)   . Hypertension   . Prostate enlargement     Surgical History: Past Surgical History:  Procedure Laterality Date  . cyst removed Rt thigh Right   . CYSTOSCOPY WITH URETHRAL DILATATION N/A 07/18/2016   Procedure: CYSTOSCOPY WITH URETHRAL DILATATION WITH FOLEY CATHETER PLACEMENT;  Surgeon: Vanna ScotlandAshley Brandon, MD;  Location: [facility];  Service: Urology;  Laterality: N/A;    Home Medications:  Allergies as of 02/22/2018   No Known Allergies     Medication List        Accurate as of 02/22/18 10:16 AM. Always use your most recent med list.          aspirin EC 81 MG tablet Take 81 mg by mouth daily.   atenolol 100 MG tablet Commonly known as:  TENORMIN Take 100 mg by mouth every morning.   doxazosin 4 MG tablet Commonly known as:  CARDURA Take 4 mg by mouth daily. afternoon   finasteride 5 MG tablet Commonly known as:  PROSCAR   hydrochlorothiazide 25 MG tablet Commonly  known as:  HYDRODIURIL   HYDROcodone-acetaminophen 5-325 MG tablet Commonly known as:  NORCO/VICODIN Take 1-2 tablets by mouth every 6 (six) hours as needed for moderate pain.   levofloxacin 500 MG tablet Commonly known as:  LEVAQUIN Take 1 tablet (500 mg total) by mouth daily.   losartan 50 MG tablet Commonly known as:  COZAAR Take 50 mg by mouth daily. In afternoon   meloxicam 15 MG tablet Commonly known as:  MOBIC meloxicam 15 mg tablet   naproxen 500 MG tablet Commonly known as:  NAPROSYN   Omega 3 1000 MG Caps Take by mouth.   saw palmetto 160 MG capsule Take 160 mg by mouth 2 (two) times daily.   simvastatin 40 MG tablet Commonly known as:  ZOCOR Take 40 mg by mouth every morning.   terbinafine 250 MG tablet Commonly known as:  LAMISIL terbinafine HCl 250 mg tablet       Allergies: No Known Allergies  Family History: Family History  Problem Relation Age of Onset  . Prostate cancer Neg Hx   . Kidney disease Neg Hx     Social History:  reports that he quit smoking about 44 years ago. He has never used smokeless tobacco. He reports that he drinks about 3.6 oz of alcohol per week. He reports that he does not use drugs.  ROS: UROLOGY Frequent Urination?: No Hard to postpone urination?:  Yes Burning/pain with urination?: No Get up at night to urinate?: Yes Leakage of urine?: Yes Urine stream starts and stops?: No Trouble starting stream?: No Do you have to strain to urinate?: No Blood in urine?: No Urinary tract infection?: No Sexually transmitted disease?: No Injury to kidneys or bladder?: No Painful intercourse?: No Weak stream?: No Erection problems?: Yes Penile pain?: No  Gastrointestinal Nausea?: No Vomiting?: No Indigestion/heartburn?: Yes Diarrhea?: No Constipation?: No  Constitutional Fever: No Night sweats?: No Weight loss?: No Fatigue?: No  Skin Skin rash/lesions?: No Itching?: No  Eyes Blurred vision?: Yes Double  vision?: No  Ears/Nose/Throat Sore throat?: No Sinus problems?: No  Hematologic/Lymphatic Swollen glands?: No Easy bruising?: No  Cardiovascular Leg swelling?: No Chest pain?: No  Respiratory Cough?: No Shortness of breath?: No  Endocrine Excessive thirst?: No  Musculoskeletal Back pain?: No Joint pain?: Yes  Neurological Headaches?: No Dizziness?: No  Psychologic Depression?: No Anxiety?: No  Physical Exam: BP (!) 174/88   Pulse 62   Resp 16   Ht 5\' 3"  (1.6 m)   Wt 97.5 kg (214 lb 14.4 oz)   SpO2 98%   BMI 38.07 kg/m   Constitutional:  Alert and oriented, No acute distress. HEENT: [place] AT, moist mucus membranes.  Trachea midline, no masses. Cardiovascular: No clubbing, cyanosis, or edema. Respiratory: Normal respiratory effort, no increased work of breathing. GI: Abdomen is soft, nontender, nondistended, no abdominal masses GU: DRE demonstrates a 30 g prostate, symmetric, no nodules. Skin: No rashes, bruises or suspicious lesions. Lymph: No cervical or inguinal adenopathy. Neurologic: Grossly intact, no focal deficits, moving all 4 extremities. Psychiatric: Normal mood and affect.  Laboratory Data: Lab Results  Component Value Date   WBC 5.4 07/05/2016   HGB 15.6 07/18/2016   HCT 46.0 07/18/2016   MCV 91.6 07/05/2016   PLT 145 (L) 07/05/2016    Lab Results  Component Value Date   CREATININE 0.64 07/05/2016    No results found for: PSA  No results found for: TESTOSTERONE  No results found for: HGBA1C  Urinalysis No results found for: COLORURINE, APPEARANCEUR, LABSPEC, PHURINE, GLUCOSEU, HGBUR, BILIRUBINUR, KETONESUR, PROTEINUR, UROBILINOGEN, NITRITE, LEUKOCYTESUR PVR: 29 mL's  Assessment & Plan:   1) bph -off medication and doing quite well  2) urethral sx - stable.   Our plan is to perform prostate cancer screening with a PSA today.  If his PSA is normal, the patient can then return to see [place] on an as-needed basis.  He needs no  additional follow-up for his urethral stricture given the stability of his symptoms and his low PVR.  He is not taking any medications for his BPH and as such does not need any additional follow-up with [place].  No follow-ups on file.  Crist Fat, MD  [facility] [address] [place], [place] [address] [phone]

## 2018-02-23 LAB — PSA: Prostate Specific Ag, Serum: 1 ng/mL (ref 0.0–4.0)

## 2022-11-19 ENCOUNTER — Emergency Department
Admission: EM | Admit: 2022-11-19 | Discharge: 2022-11-19 | Disposition: A | Discharge status: Home / Self Care | Payer: Medicare Other | Attending: Emergency Medicine | Admitting: Emergency Medicine

## 2022-11-19 ENCOUNTER — Emergency Department: Payer: Medicare Other

## 2022-11-19 DIAGNOSIS — N50811 Right testicular pain: Secondary | ICD-10-CM | POA: Diagnosis present

## 2022-11-19 DIAGNOSIS — I10 Essential (primary) hypertension: Secondary | ICD-10-CM | POA: Insufficient documentation

## 2022-11-19 DIAGNOSIS — N3090 Cystitis, unspecified without hematuria: Secondary | ICD-10-CM

## 2022-11-19 DIAGNOSIS — R310 Gross hematuria: Secondary | ICD-10-CM

## 2022-11-19 LAB — URINALYSIS, ROUTINE W REFLEX MICROSCOPIC
Bilirubin Urine: NEGATIVE
Glucose, UA: NEGATIVE mg/dL
Ketones, ur: NEGATIVE mg/dL
Nitrite: NEGATIVE
Protein, ur: 100 mg/dL — AB
Specific Gravity, Urine: 1.014 (ref 1.005–1.030)
Squamous Epithelial / HPF: NONE SEEN /HPF (ref 0–5)
WBC, UA: >50 WBC/hpf — ABNORMAL HIGH (ref 0–5)
pH: 5 (ref 5.0–8.0)

## 2022-11-19 LAB — CHLAMYDIA/NGC RT PCR (ARMC ONLY)
Chlamydia Tr: NOT DETECTED
N gonorrhoeae: NOT DETECTED

## 2022-11-19 MED ORDER — CEFDINIR 300 MG PO CAPS: 300.0000 mg | ORAL_CAPSULE | Freq: Two times a day (BID) | ORAL | 0 refills | Status: AC

## 2022-11-19 MED ORDER — CEFDINIR 300 MG PO CAPS
300.0000 mg | ORAL_CAPSULE | Freq: Once | ORAL | Status: AC
  Administered 2022-11-19: 300 mg via ORAL
  Filled 2022-11-19: qty 1

## 2022-11-19 MED ORDER — CEFDINIR 300 MG PO CAPS: 300.0000 mg | ORAL_CAPSULE | Freq: Two times a day (BID) | ORAL | 0 refills | Status: DC

## 2022-11-19 NOTE — ED Notes (Signed)
This RN accompanied EDP Jessup during exam.

## 2022-11-19 NOTE — ED Triage Notes (Signed)
RIGHT sided testicular pain and suprapubic pain that began approx 2 days ago (Testicular pain improves when area is supported); He reports dysuria and seeing blood in his urine as well

## 2022-11-19 NOTE — ED Notes (Signed)
Family at bedside. Patient ambulating in room with slow, steady gait.

## 2022-11-19 NOTE — ED Notes (Signed)
Report given to next RN.

## 2022-11-19 NOTE — ED Provider Notes (Signed)
[facility] Provider Note    Event Date/Time   First MD Initiated Contact with Patient 11/19/22 1747     (approximate)   History   Chief Complaint Testicle Pain (RIGHT sided testicular pain and suprapubic pain that began approx 2 days ago (Testicular pain improves when area is supported); He reports dysuria and seeing blood in his urine as well)   HPI  Jacob Sellers is a 77 y.o. male with past medical history of hypertension, hyperlipidemia, and BPH who presents to the ED complaining of testicle pain.  History is limited as patient is Spanish-speaking only, history obtained via interpreter [phone].  Patient reports that he has had 2 days of constant pain in his suprapubic area as well as his right testicle.  He describes burning when he urinates and has noticed a small amount of blood in his urine.  His right testicle has seemed swollen and sore to touch, but he denies any trauma to the area.  He has not had any penile discharge and states he is not sexually active.  He denies any history of similar symptoms.      Physical Exam   Triage Vital Signs: ED Triage Vitals  Enc Vitals Group     BP 11/19/22 1617 137/71     Pulse Rate 11/19/22 1617 72     Resp 11/19/22 1617 20     Temp 11/19/22 1617 98 F (36.7 C)     Temp src --      SpO2 11/19/22 1617 97 %     Weight 11/19/22 1625 190 lb (86.2 kg)     Height 11/19/22 1625 5' 2.99" (1.6 m)     Head Circumference --      Peak Flow --      Pain Score 11/19/22 1627 5     Pain Loc --      Pain Edu? --      Excl. in [place]? --     Most recent vital signs: Vitals:   11/19/22 1617  BP: 137/71  Pulse: 72  Resp: 20  Temp: 98 F (36.7 C)  SpO2: 97%    Constitutional: Alert and oriented. Eyes: Conjunctivae are normal. Head: Atraumatic. Nose: No congestion/rhinnorhea. Mouth/Throat: Mucous membranes are moist.  Cardiovascular: Normal rate, regular rhythm. Grossly normal heart sounds.  2+ radial pulses  bilaterally. Respiratory: Normal respiratory effort.  No retractions. Lungs CTAB. Gastrointestinal: Soft and tender to palpation in the suprapubic area.  No CVA tenderness bilaterally. No distention. Genitourinary: Right testicular edema and tenderness, no scrotal erythema, warmth, or drainage. Musculoskeletal: No lower extremity tenderness nor edema.  Neurologic:  Normal speech and language. No gross focal neurologic deficits are appreciated.    ED Results / Procedures / Treatments   Labs (all labs ordered are listed, but only abnormal results are displayed) Labs Reviewed  URINALYSIS, ROUTINE W REFLEX MICROSCOPIC - Abnormal; Notable for the following components:      Result Value   Color, Urine YELLOW (*)    APPearance CLOUDY (*)    Hgb urine dipstick MODERATE (*)    Protein, ur 100 (*)    Leukocytes,Ua LARGE (*)    WBC, UA >50 (*)    Bacteria, UA RARE (*)    All other components within normal limits  CHLAMYDIA/NGC RT PCR ([facility] ONLY)            URINE CULTURE   RADIOLOGY Testicular ultrasound reviewed and interpreted by me with no evidence of torsion.  PROCEDURES:  Critical Care performed: No  Procedures   MEDICATIONS ORDERED IN ED: Medications  cefdinir (OMNICEF) capsule 300 mg (has no administration in time range)     IMPRESSION / MDM / ASSESSMENT AND PLAN / ED COURSE  I reviewed the triage vital signs and the nursing notes.                              77 y.o. male with past medical history of hypertension, hyperlipidemia, and BPH who presents to the ED with 2 days of dysuria, hematuria, suprapubic pain, and right testicular pain.  Patient's presentation is most consistent with acute presentation with potential threat to life or bodily function.  Differential diagnosis includes, but is not limited to, testicular torsion, epididymitis, cystitis, inguinal hernia, varicocele, hydrocele.  Patient nontoxic-appearing and in no acute distress, vital signs are  unremarkable.  He has tenderness to palpation both at his right testicle and suprapubic area, no scrotal erythema, warmth, or drainage noted to suggest cellulitis or other soft tissue infection.  Urinalysis appears concerning for infection and we will send for culture.  Ultrasound is remarkable for echogenic foci in the left testicle, likely unrelated to patient's presentation.  Ultrasound additionally shows trace left hydrocele and bilateral varicoceles, but no evidence of torsion or epididymitis.  Plan to treat apparent cystitis with cefdinir and patient provided with referral to follow-up with urology, also counseled to follow-up with his PCP.  He was counseled to return to the ED for new or worsening symptoms, patient agrees with plan.      FINAL CLINICAL IMPRESSION(S) / ED DIAGNOSES   Final diagnoses:  Cystitis  Gross hematuria  Pain in right testicle     Rx / DC Orders   ED Discharge Orders          Ordered    cefdinir (OMNICEF) 300 MG capsule  2 times daily        11/19/22 1921             Note:  This document was prepared using Dragon voice recognition software and may include unintentional dictation errors.   Blake Divine, MD 11/19/22 1929

## 2022-11-22 LAB — URINE CULTURE: Culture: >=100000 — AB

## 2022-11-25 ENCOUNTER — Encounter: Payer: Self-pay | Admitting: Urology

## 2022-11-25 ENCOUNTER — Ambulatory Visit (INDEPENDENT_AMBULATORY_CARE_PROVIDER_SITE_OTHER): Payer: Medicare Other | Admitting: Urology

## 2022-11-25 VITALS — BP 110/65 | HR 80 | Ht 62.0 in | Wt 199.8 lb

## 2022-11-25 DIAGNOSIS — Z87448 Personal history of other diseases of urinary system: Secondary | ICD-10-CM

## 2022-11-25 DIAGNOSIS — N50811 Right testicular pain: Secondary | ICD-10-CM | POA: Diagnosis not present

## 2022-11-25 DIAGNOSIS — N433 Hydrocele, unspecified: Secondary | ICD-10-CM | POA: Diagnosis not present

## 2022-11-25 DIAGNOSIS — N401 Enlarged prostate with lower urinary tract symptoms: Secondary | ICD-10-CM | POA: Diagnosis not present

## 2022-11-25 DIAGNOSIS — N390 Urinary tract infection, site not specified: Secondary | ICD-10-CM

## 2022-11-25 DIAGNOSIS — N400 Enlarged prostate without lower urinary tract symptoms: Secondary | ICD-10-CM

## 2022-11-25 DIAGNOSIS — N138 Other obstructive and reflux uropathy: Secondary | ICD-10-CM

## 2022-11-25 LAB — BLADDER SCAN AMB NON-IMAGING

## 2022-11-25 NOTE — Progress Notes (Signed)
I, DeAsia L Maxie,acting as a scribe for Hollice Espy, MD.,have documented all relevant documentation on the behalf of Hollice Espy, MD,as directed by  Hollice Espy, MD while in the presence of Hollice Espy, MD.   I, Manata as a scribe for Hollice Espy, MD.,have documented all relevant documentation on the behalf of Hollice Espy, MD,as directed by  Hollice Espy, MD while in the presence of Hollice Espy, MD.   11/25/22 5:56 PM   Jacob Sellers 10-Jun-1946 [id number]  Referring provider: Freddy Finner, NP [place],  [place] [address]  Chief Complaint  Patient presents with   Testicle Pain    HPI: 77 year-old male who presents today for a follow up from a recent ER visit.   Notably, he is known to our practice last seen in 2019 by multiple physicians for history of bulbar urethral structure and BPH. He underwent urethral dilation back in 2017. He was previously managed on Finasteride and Doxazosin for his BPH.   More recently, he presented to the ER on November 19, 2022 complaining of right testicular pain for about 2 days. He was also having dysuria and gross hematuria. His urinalysis was positive.Urine culture showed E.Coli. His right testicle was swollen and tender to touch. He had a scrotal ultrasound which showed an indeterminate echogenic foci within the left testicle of uncertain significance, trace of hydrocele and bilateral varicoceles. He was treated with Cefdinir and referred back to see Urology.   He notes less pain, urgency, frequency. He states that it feels like his testicle is "hard". He reports after the ultrasound he noticed more swelling.   Results for orders placed or performed in visit on 11/25/22  Bladder Scan (Post Void Residual) in office  Result Value Ref Range   Scan Result 32ml      IPSS     Row Name 11/25/22 1600         International Prostate Symptom Score   How often have you had the sensation  of not emptying your bladder? Less than 1 in 5     How often have you had to urinate less than every two hours? Less than 1 in 5 times     How often have you found you stopped and started again several times when you urinated? Not at All     How often have you found it difficult to postpone urination? More than half the time     How often have you had a weak urinary stream? Less than half the time     How often have you had to strain to start urination? Less than 1 in 5 times     How many times did you typically get up at night to urinate? 4 Times     Total IPSS Score 13       Quality of Life due to urinary symptoms   If you were to spend the rest of your life with your urinary condition just the way it is now how would you feel about that? Unhappy              Score:  1-7 Mild 8-19 Moderate 20-35 Severe   PMH: Past Medical History:  Diagnosis Date   Arthritis    Heartburn arthritis   HLD (hyperlipidemia)    Hypertension    Prostate enlargement     Surgical History: Past Surgical History:  Procedure Laterality Date   cyst removed Rt thigh Right  CYSTOSCOPY WITH URETHRAL DILATATION N/A 07/18/2016   Procedure: CYSTOSCOPY WITH URETHRAL DILATATION WITH FOLEY CATHETER PLACEMENT;  Surgeon: Hollice Espy, MD;  Location: [facility] ORS;  Service: Urology;  Laterality: N/A;    Home Medications:  Allergies as of 11/25/2022   No Known Allergies      Medication List        Accurate as of November 25, 2022  5:56 PM. If you have any questions, ask your nurse or doctor.          STOP taking these medications    aspirin EC 81 MG tablet Stopped by: Hollice Espy, MD   hydrochlorothiazide 25 MG tablet Commonly known as: HYDRODIURIL Stopped by: Hollice Espy, MD   HYDROcodone-acetaminophen 5-325 MG tablet Commonly known as: NORCO/VICODIN Stopped by: Hollice Espy, MD   levofloxacin 500 MG tablet Commonly known as: LEVAQUIN Stopped by: Hollice Espy, MD   meloxicam  15 MG tablet Commonly known as: MOBIC Stopped by: Hollice Espy, MD   naproxen 500 MG tablet Commonly known as: NAPROSYN Stopped by: Hollice Espy, MD   Omega 3 1000 MG Caps Stopped by: Hollice Espy, MD   saw palmetto 160 MG capsule Stopped by: Hollice Espy, MD   terbinafine 250 MG tablet Commonly known as: LAMISIL Stopped by: Hollice Espy, MD       TAKE these medications    atenolol 100 MG tablet Commonly known as: TENORMIN Take 100 mg by mouth every morning.   cefdinir 300 MG capsule Commonly known as: OMNICEF Take 1 capsule (300 mg total) by mouth 2 (two) times daily for 10 days.   chlorthalidone 50 MG tablet Commonly known as: HYGROTON Take 50 mg by mouth daily.   doxazosin 4 MG tablet Commonly known as: CARDURA Take 4 mg by mouth daily. afternoon   finasteride 5 MG tablet Commonly known as: PROSCAR   losartan 100 MG tablet Commonly known as: COZAAR Take 100 mg by mouth daily. What changed: Another medication with the same name was removed. Continue taking this medication, and follow the directions you see here. Changed by: Hollice Espy, MD   simvastatin 80 MG tablet Commonly known as: ZOCOR Take 80 mg by mouth daily. What changed: Another medication with the same name was removed. Continue taking this medication, and follow the directions you see here. Changed by: Hollice Espy, MD        Family History: Family History  Problem Relation Age of Onset   Prostate cancer Neg Hx    Kidney disease Neg Hx     Social History:  reports that he quit smoking about 49 years ago. His smoking use included cigarettes. He has never used smokeless tobacco. He reports current alcohol use of about 6.0 standard drinks of alcohol per week. He reports that he does not use drugs.  Pertinent Imaging: EXAM: SCROTAL ULTRASOUND   DOPPLER ULTRASOUND OF THE TESTICLES   TECHNIQUE: Complete ultrasound examination of the testicles, epididymis, and other  scrotal structures was performed. Color and spectral Doppler ultrasound were also utilized to evaluate blood flow to the testicles.   COMPARISON:  03/29/2012   FINDINGS: Right testicle   Measurements: 4.6 x 3.3 x 2.4 cm. No mass or microlithiasis visualized.   Left testicle   Measurements: 4.8 x 3.1 x 2.4 cm. There are 2 echogenic foci within the left testicle of uncertain clinical significance, measuring 6 x 3 x 4 mm in the upper aspect and 3 x 3 x 2 mm in the inferior aspect of the left testicle.  There is no associated mass effect or microlithiasis visualized.   Right epididymis: Small epididymal cysts measuring up to 5 mm are of doubtful clinical significance. Otherwise normal appearance and size.   Left epididymis: Chronic heterogeneity of the left epididymis, with 6 mm epididymal cyst and adjacent nonspecific increased echogenicity of the epididymal head, not appreciably changed since prior exam.   Hydrocele:  Trace left hydrocele.  No right-sided hydrocele.   Varicocele:  Bilateral varicoceles, left greater than right.   Pulsed Doppler interrogation of both testes demonstrates normal low resistance arterial and venous waveforms bilaterally.   Other: Scrotal wall thickening measuring up to 6 mm on the right and 5 mm on the left.   IMPRESSION: 1. Indeterminate subcentimeter echogenic foci within the left testicle, of uncertain significance. No mass effect. 2. No evidence of testicular torsion. 3. Bilateral epididymal cysts, of doubtful clinical significance. 4. Trace left hydrocele. 5. Bilateral varicoceles, left greater than right.     Electronically Signed   By: Sharlet Salina M.D.   On: 11/19/2022 18:14   Personally reviewed and agree with radiologic interpretation.  Physical Exam: BP 110/65 (BP Location: Left Arm, Patient Position: Sitting, Cuff Size: Normal)   Pulse 80   Ht 5\' 2"  (1.575 m)   Wt 199 lb 12.8 oz (90.6 kg)   BMI 36.54 kg/m    Constitutional:  Alert and oriented, No acute distress. HEENT: [place] AT, moist mucus membranes.  Trachea midline, no masses. GU: hydrocele on the right lemon-size, unable to palpate on that side. Left side was normal, no erythema or scrotal skin changes Neurologic: Grossly intact, no focal deficits, moving all 4 extremities. Psychiatric: Normal mood and affect.   MPRESSION: 1. Indeterminate subcentimeter echogenic foci within the left testicle, of uncertain significance. No mass effect. 2. No evidence of testicular torsion. 3. Bilateral epididymal cysts, of doubtful clinical significance. 4. Trace left hydrocele. 5. Bilateral varicoceles, left greater than right.     Electronically Signed   By: M.D.   On: 11/19/2022 18:14  I personally reviewed the study and agree with radiologic interpretation.  Assessment & Plan:    1. Urinary tract infection - He's being appropriately treated. Complete the course of antibiotics. His E. coli should be sensitive to this.  2. Testicular pain - Likely related to number one, supportive care.  3.  Left testicular lesion - Equivocal indeterminate on ultrasound. We'll plan to repeat the scrotal ultrasound about 3 months in the abscence of infection to reassess   4. BPH with outlet obstruction - He will continue Doxazosin and Finasteride.  - Repeat PSA when he returns in 3 months. We will defer this today in light of his recent infection.  5. History of urethral stricture - PVR is low. Potentially contributing factor to his infection will monitor for recurrent infections.  4. Right Hydrocele - Likely reactive may improve with time. Recommend scrotal support. - Repeat scrotal ultrasound/ exam  in 3 months  Return in about 3 months (around 02/24/2023) for with PSA and scrotal ultrasound. Or sooner if not improving.  I have reviewed the above documentation for accuracy and completeness, and I agree with the above.   02/26/2023,  MD   [facility] [address], [address] [place], [place] [id number]

## 2022-12-16 ENCOUNTER — Other Ambulatory Visit: Payer: Self-pay

## 2022-12-16 DIAGNOSIS — R300 Dysuria: Secondary | ICD-10-CM

## 2022-12-16 NOTE — Progress Notes (Unsigned)
12/19/2022 9:19 PM   Jacob Sellers Jacob Sellers July 12, 1946 [id number]  Referring provider: Freddy Finner, NP [place],  [place] [address]  Urological history: 1. Urethral stricture -dilation (2017)  2. BPH with LU TS -doxazosin and finasteride 5 mg daily   3. Hydrocele  -scrotal US (11/2022) - trace left hydrocele  4. Epididymal cysts -scrotal US (11/2022) - bilateral epididymal cysts  5. 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 Left testicular lesion -repeat US in 3 months   No chief complaint on file.   HPI: Jacob Sellers is a 77 y.o. male who presents today for burning and pain.  UA ***     PMH: Past Medical History:  Diagnosis Date   Arthritis    Heartburn arthritis   HLD (hyperlipidemia)    Hypertension    Prostate enlargement     Surgical History: Past Surgical History:  Procedure Laterality Date   cyst removed Rt thigh Right    CYSTOSCOPY WITH URETHRAL DILATATION N/A 07/18/2016   Procedure: CYSTOSCOPY WITH URETHRAL DILATATION WITH FOLEY CATHETER PLACEMENT;  Surgeon: Hollice Espy, MD;  Location: [facility];  Service: Urology;  Laterality: N/A;    Home Medications:  Allergies as of 12/19/2022   No Known Allergies      Medication List        Accurate as of December 16, 2022  9:19 PM. If you have any questions, ask your nurse or doctor.          atenolol 100 MG tablet Commonly known as: TENORMIN Take 100 mg by mouth every morning.   chlorthalidone 50 MG tablet Commonly known as: HYGROTON Take 50 mg by mouth daily.   doxazosin 4 MG tablet Commonly known as: CARDURA Take 4 mg by mouth daily. afternoon   finasteride 5 MG tablet Commonly known as: PROSCAR   losartan 100 MG tablet Commonly known as: COZAAR Take 100 mg by mouth daily.   simvastatin 80 MG tablet Commonly known as: ZOCOR Take 80 mg by mouth daily.        Allergies: No Known Allergies  Family History: Family History   Problem Relation Age of Onset   Prostate cancer Neg Hx    Kidney disease Neg Hx     Social History:  reports that he quit smoking about 49 years ago. His smoking use included cigarettes. He has never used smokeless tobacco. He reports current alcohol use of about 6.0 standard drinks of alcohol per week. He reports that he does not use drugs.  ROS: Pertinent ROS in HPI  Physical Exam: There were no vitals taken for this visit.  Constitutional:  Well nourished. Alert and oriented, No acute distress. HEENT:  AT, moist mucus membranes.  Trachea midline, no masses. Cardiovascular: No clubbing, cyanosis, or edema. Respiratory: Normal respiratory effort, no increased work of breathing. GI: Abdomen is soft, non tender, non distended, no abdominal masses. Liver and spleen not palpable.  No hernias appreciated.  Stool sample for occult testing is not indicated.   GU: No CVA tenderness.  No bladder fullness or masses.  Patient with circumcised/uncircumcised phallus. ***Foreskin easily retracted***  Urethral meatus is patent.  No penile discharge. No penile lesions or rashes. Scrotum without lesions, cysts, rashes and/or edema.  Testicles are located scrotally bilaterally. No masses are appreciated in the testicles. Left and right epididymis are normal. Rectal: Patient with  normal sphincter tone. Anus and perineum without scarring or rashes. No rectal masses are appreciated. Prostate is approximately ***  grams, *** nodules are appreciated. Seminal vesicles are normal. Skin: No rashes, bruises or suspicious lesions. Lymph: No cervical or inguinal adenopathy. Neurologic: Grossly intact, no focal deficits, moving all 4 extremities. Psychiatric: Normal mood and affect.  Laboratory Data: Urinalysis *** I have reviewed the labs.   Pertinent Imaging: N/A  Assessment & Plan:  ***  1. Dysuria ***  No follow-ups on file.  These notes generated with voice recognition software. I apologize for  typographical errors.  [place], [place] [address]  [place], [place] [address] [phone]

## 2022-12-19 ENCOUNTER — Other Ambulatory Visit
Admission: RE | Admit: 2022-12-19 | Discharge: 2022-12-19 | Disposition: A | Discharge status: Home / Self Care | Payer: Medicare Other | Attending: Urology | Admitting: Urology

## 2022-12-19 ENCOUNTER — Encounter: Payer: Self-pay | Admitting: Urology

## 2022-12-19 ENCOUNTER — Ambulatory Visit (INDEPENDENT_AMBULATORY_CARE_PROVIDER_SITE_OTHER): Payer: Medicare Other | Admitting: Urology

## 2022-12-19 VITALS — BP 145/72 | HR 60 | Ht 62.0 in | Wt 198.4 lb

## 2022-12-19 DIAGNOSIS — R300 Dysuria: Secondary | ICD-10-CM | POA: Diagnosis not present

## 2022-12-19 DIAGNOSIS — N433 Hydrocele, unspecified: Secondary | ICD-10-CM | POA: Diagnosis not present

## 2022-12-19 DIAGNOSIS — N509 Disorder of male genital organs, unspecified: Secondary | ICD-10-CM

## 2022-12-19 LAB — URINALYSIS, COMPLETE (UACMP) WITH MICROSCOPIC
Bilirubin Urine: NEGATIVE
Glucose, UA: NEGATIVE mg/dL
Ketones, ur: NEGATIVE mg/dL
Nitrite: NEGATIVE
Protein, ur: NEGATIVE mg/dL
Specific Gravity, Urine: 1.015 (ref 1.005–1.030)
Squamous Epithelial / HPF: NONE SEEN /HPF (ref 0–5)
pH: 5.5 (ref 5.0–8.0)

## 2022-12-19 MED ORDER — SULFAMETHOXAZOLE-TRIMETHOPRIM 800-160 MG PO TABS: 1.0000 | ORAL_TABLET | Freq: Two times a day (BID) | ORAL | 0 refills | Status: DC

## 2022-12-21 ENCOUNTER — Telehealth: Payer: Self-pay | Admitting: Family Medicine

## 2022-12-21 LAB — URINE CULTURE: Culture: >=100000 — AB

## 2022-12-21 NOTE — Telephone Encounter (Signed)
-----   Message from Nori Riis, PA-C sent at 12/21/2022 11:52 AM EST ----- Please let Mr. Stech know that his urine culture did return positive for infection and the antibiotic I prescribed is the appropriate antibiotic.  He should be starting to feel better and if so, we will see him in 2 weeks time.  If he does not feel that he is improving or is starting to get worse, please let us know.

## 2022-12-21 NOTE — Telephone Encounter (Signed)
Patient notified and voiced understanding. Appointment is made.

## 2022-12-30 NOTE — Progress Notes (Unsigned)
01/02/2023 3:26 PM   Jacob Sellers 1946-10-23 [id number]  Referring provider: Freddy Finner, NP [place],   [id number]  Urological history: 1. Urethral stricture -dilation (2017)  2. BPH with LU TS -doxazosin and finasteride 5 mg daily   3. Hydrocele  -scrotal US (11/2022) - trace left hydrocele  4. Epididymal cysts -scrotal US (11/2022) - bilateral epididymal cysts  5. 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 Left testicular lesion -repeat US in 3 months   Chief Complaint  Patient presents with   Follow-up    HPI: Jacob Sellers is a 77 y.o. male who presents today for burning and pain with interpreter, Angelica [id number] and his son, Debroah Baller.  At his visit on 12/19/2022, he started to experience dysuria and the return of right scrotal pain and heaviness.  He states it occurred after walking.  UA yellow clear, specific gravity 1.015, pH 5.5, hemoglobin trace, leukocyte trace, 6-10 WBCs, 0-5 RBCs, many bacteria and WBC clumps present.  Urine culture was positive for E. Coli.  He was given 7 days of culture appropriate antibiotics.  He states that the pain and swelling have abated.  He has completed his antibiotics.  Patient denies any modifying or aggravating factors.  Patient denies any gross hematuria, dysuria or suprapubic/flank pain.  Patient denies any fevers, chills, nausea or vomiting.    PMH: Past Medical History:  Diagnosis Date   Arthritis    Heartburn arthritis   HLD (hyperlipidemia)    Hypertension    Prostate enlargement     Surgical History: Past Surgical History:  Procedure Laterality Date   cyst removed Rt thigh Right    CYSTOSCOPY WITH URETHRAL DILATATION N/A 07/18/2016   Procedure: CYSTOSCOPY WITH URETHRAL DILATATION WITH FOLEY CATHETER PLACEMENT;  Surgeon: Hollice Espy, MD;  Location: [facility] OR[facility];  Service: Urology;  Laterality: N/A;    Home Medications:  Allergies as of 01/02/2023   No  Known Allergies      Medication List        Accurate as of January 02, 2023  3:26 PM. If you have any questions, ask your nurse or doctor.          STOP taking these medications    sulfamethoxazole-trimethoprim 800-160 MG tablet Commonly known as: BACTRIM DS Stopped by: Zara Council, PA-C       TAKE these medications    atenolol 100 MG tablet Commonly known as: TENORMIN Take 100 mg by mouth every morning.   chlorthalidone 50 MG tablet Commonly known as: HYGROTON Take 50 mg by mouth daily.   doxazosin 4 MG tablet Commonly known as: CARDURA Take 4 mg by mouth daily. afternoon   finasteride 5 MG tablet Commonly known as: PROSCAR   losartan 100 MG tablet Commonly known as: COZAAR Take 100 mg by mouth daily.   simvastatin 80 MG tablet Commonly known as: ZOCOR Take 80 mg by mouth daily.        Allergies: No Known Allergies  Family History: Family History  Problem Relation Age of Onset   Prostate cancer Neg Hx    Kidney disease Neg Hx     Social History:  reports that he quit smoking about 49 years ago. His smoking use included cigarettes. He has never used smokeless tobacco. He reports current alcohol use of about 6.0 standard drinks of alcohol per week. He reports that he does not use drugs.  ROS: Pertinent ROS in HPI  Physical Exam: BP 134/69  Pulse (!) 59   Ht '5\' 2"'$  (1.575 m)   Wt 199 lb 12.8 oz (90.6 kg)   BMI 36.54 kg/m   Constitutional:  Well nourished. Alert and oriented, No acute distress. HEENT: [place] AT, moist mucus membranes.  Trachea midline Cardiovascular: No clubbing, cyanosis, or edema. Respiratory: Normal respiratory effort, no increased work of breathing. GU: No CVA tenderness.  No bladder fullness or masses.  Patient with uncircumcised phallus. Foreskin easily retracted  Urethral meatus is patent.  No penile discharge. No penile lesions or rashes. Scrotum without lesions, cysts, rashes and/or edema.  Right hemiscrotum is still  enlarged, no erythema is noted, the 3 cm area of induration in the right epididymis has softened and the right testicle is palpated and nontender.  No crepitus, fluctuance or drainage noted.  The left testicle is located scrotal he, no masses are appreciated in the left testicle and the left epididymis is normal.   Neurologic: Grossly intact, no focal deficits, moving all 4 extremities. Psychiatric: Normal mood and affect.   Laboratory Data: Urinalysis See EPIC and HPI I have reviewed the labs.   Pertinent Imaging: N/A  Assessment & Plan:    1. Dysuria -secondary to UTI -resolved  2.  Right hydrocele -still present -Repeat scrotal ultrasound is due in April  3.  Left testicular lesion -repeat scrotal US due in April  -Emphasized the importance of keeping that appointment to repeat the ultrasound of the scrotum to ensure that he does not have any malignant pathology in his testicle  Return for keep follow up in April with Dr. Erlene Quan .  These notes generated with voice recognition software. I apologize for typographical errors.  [place], [place] [address]  [place], [place] [address] [phone]

## 2023-01-02 ENCOUNTER — Encounter: Payer: Self-pay | Admitting: Urology

## 2023-01-02 ENCOUNTER — Ambulatory Visit (INDEPENDENT_AMBULATORY_CARE_PROVIDER_SITE_OTHER): Payer: Medicare Other | Admitting: Urology

## 2023-01-02 VITALS — BP 134/69 | HR 59 | Ht 62.0 in | Wt 199.8 lb

## 2023-01-02 DIAGNOSIS — R300 Dysuria: Secondary | ICD-10-CM

## 2023-01-02 DIAGNOSIS — N509 Disorder of male genital organs, unspecified: Secondary | ICD-10-CM

## 2023-01-02 DIAGNOSIS — N433 Hydrocele, unspecified: Secondary | ICD-10-CM

## 2023-02-15 ENCOUNTER — Ambulatory Visit
Admission: RE | Admit: 2023-02-15 | Discharge: 2023-02-15 | Disposition: A | Discharge status: Home / Self Care | Payer: Medicare Other | Source: Ambulatory Visit | Attending: Urology | Admitting: Urology

## 2023-02-15 DIAGNOSIS — N50811 Right testicular pain: Secondary | ICD-10-CM | POA: Diagnosis present

## 2023-02-24 ENCOUNTER — Other Ambulatory Visit
Admission: RE | Admit: 2023-02-24 | Discharge: 2023-02-24 | Disposition: A | Discharge status: Home / Self Care | Payer: Medicare Other | Attending: Urology | Admitting: Urology

## 2023-02-24 ENCOUNTER — Ambulatory Visit (INDEPENDENT_AMBULATORY_CARE_PROVIDER_SITE_OTHER): Payer: Medicare Other | Admitting: Urology

## 2023-02-24 ENCOUNTER — Encounter: Payer: Self-pay | Admitting: Urology

## 2023-02-24 VITALS — BP 135/67 | HR 62 | Ht 62.0 in | Wt 204.4 lb

## 2023-02-24 DIAGNOSIS — N400 Enlarged prostate without lower urinary tract symptoms: Secondary | ICD-10-CM

## 2023-02-24 DIAGNOSIS — N509 Disorder of male genital organs, unspecified: Secondary | ICD-10-CM

## 2023-02-24 DIAGNOSIS — R351 Nocturia: Secondary | ICD-10-CM | POA: Diagnosis not present

## 2023-02-24 DIAGNOSIS — Z87440 Personal history of urinary (tract) infections: Secondary | ICD-10-CM | POA: Diagnosis not present

## 2023-02-24 DIAGNOSIS — N390 Urinary tract infection, site not specified: Secondary | ICD-10-CM

## 2023-02-24 LAB — PSA: Prostatic Specific Antigen: 1.11 ng/mL (ref 0.00–4.00)

## 2023-02-24 NOTE — Progress Notes (Signed)
I, Chaya Jan Maxie,acting as a scribe for [name], MD.,have documented all relevant documentation on the behalf of [name], MD,as directed by  [name], MD while in the presence of [name], MD.   02/24/23 4:26 PM   Jacob Sellers [birth year] [id number]  Referring provider: Sandrea Hughs, NP [address] [place],  [place] [address]  Chief Complaint  Patient presents with   Follow-up    HPI: 77 year-old male who presents today for 3 month follow-up.  He was initially seen and evaluated by me in January after developing E. coli urinary tract infection and right testicular pain. He had a scrotal ultrasound at that point in time that showed very small echogenic focus within the left testicle of unsignificant size. He also had a reactive right hydrocele.   His BPH was being managed with Doxazosin and Finasteride.  He saw Carollee Herter in February for another episode of dysuria and his urine culture was positive for E. Coli.  Scrotal ultrasound 02/15/23 shows a stable vague hyperechoic focus in the left testicle felt to be possibly fatter lipoma.  Notably, he does have a personal history of urethral stricture.  Reports nocturia, waking up approximately three times at night to urinate. He denies dysuria, testicular pain, recurrent infections.   PMH: Past Medical History:  Diagnosis Date   Arthritis    Heartburn arthritis   HLD (hyperlipidemia)    Hypertension    Prostate enlargement     Surgical History: Past Surgical History:  Procedure Laterality Date   cyst removed Rt thigh Right    CYSTOSCOPY WITH URETHRAL DILATATION N/A 07/18/2016   Procedure: CYSTOSCOPY WITH URETHRAL DILATATION WITH FOLEY CATHETER PLACEMENT;  Surgeon: [name], MD;  Location: [facility] ORS;  Service: Urology;  Laterality: N/A;    Home Medications:  Allergies as of 02/24/2023   No Known Allergies      Medication List        Accurate as of February 24, 2023  4:26 PM. If  you have any questions, ask your nurse or doctor.          atenolol 50 MG tablet Commonly known as: TENORMIN Take 50 mg by mouth daily. What changed: Another medication with the same name was removed. Continue taking this medication, and follow the directions you see here. Changed by: [name], MD   chlorthalidone 50 MG tablet Commonly known as: HYGROTON Take 50 mg by mouth daily.   doxazosin 4 MG tablet Commonly known as: CARDURA Take 4 mg by mouth daily. afternoon   finasteride 5 MG tablet Commonly known as: PROSCAR   losartan 100 MG tablet Commonly known as: COZAAR Take 100 mg by mouth daily.   simvastatin 80 MG tablet Commonly known as: ZOCOR Take 80 mg by mouth daily.        Family History: Family History  Problem Relation Age of Onset   Prostate cancer Neg Hx    Kidney disease Neg Hx     Social History:  reports that he quit smoking about 49 years ago. His smoking use included cigarettes. He has never used smokeless tobacco. He reports current alcohol use of about 6.0 standard drinks of alcohol per week. He reports that he does not use drugs.   Physical Exam: BP 135/67 (BP Location: Left Arm, Patient Position: Sitting, Cuff Size: Normal)   Pulse 62   Ht  (1.575 m)   Wt 204 lb 6.4 oz (92.7 kg)   BMI 37.39 kg/m  Constitutional:  Alert and oriented, No acute distress. HEENT: [place] AT, moist mucus membranes.  Trachea midline, no masses. Neurologic: Grossly intact, no focal deficits, moving all 4 extremities. Psychiatric: Normal mood and affect.  Pertinent Imaging:  EXAM: SCROTAL ULTRASOUND   DOPPLER ULTRASOUND OF THE TESTICLES   TECHNIQUE: Complete ultrasound examination of the testicles, epididymis, and other scrotal structures was performed. Color and spectral Doppler ultrasound were also utilized to evaluate blood flow to the testicles.   COMPARISON:  11/19/2022   FINDINGS: Right testicle   Measurements: 4.9 x 2.2 x 2.7 cm.  Normal echogenicity without mass or calcification. Internal blood flow present on color Doppler imaging.   Left testicle   Measurements: 5.3 x 2.7 x 3.3 cm. Vague hyperechoic focus within mid upper LEFT testis measuring 4 mm diameter question minimal fat/small lipoma. No additional masses or calcifications. Internal blood flow present on color Doppler imaging.   Right epididymis:  Normal in size and appearance.   Left epididymis: 2 small cysts at LEFT epididymal head measuring 5 mm and 7 mm in greatest sizes. Remainder unremarkable.   Hydrocele: Small LEFT hydrocele containing debris. No RIGHT hydrocele. Prominent fat along LEFT spermatic cord.   Varicocele:  BILATERAL varicoceles LEFT greater than RIGHT   Pulsed Doppler interrogation of both testes demonstrates normal low resistance arterial and venous waveforms bilaterally.   IMPRESSION: BILATERAL varicoceles LEFT greater than RIGHT.   Small LEFT hydrocele containing debris.   4 mm vague hyperechoic focus within LEFT testis question minimal fat/small lipoma.   Prominent fat along LEFT spermatic cord, LEFT inguinal hernia not completely excluded, recommend correlation with physical exam.   Remainder of exam unremarkable.     Electronically Signed   By: Ulyses Southward M.D.   On: 02/17/2023 16:17   Personally reviewed and agree with radiologic interpretation.   Assessment & Plan:    Nocturia -Advised to limit fluid intake four hours before bedtime. -Discussed the pathophysiology of nocturia specially when his daytime symptoms are well-controlled. -Consider discussing sleep study with PCP, will may be related to undiagnosed untreated sleep apnea  BPH  -Continue Doxazosin and Finasteride -PSA drawn today to ensure r/o advanced metastatic prostate cancer  Recurrent UTI's -If he continues to have infections, consider cystoscopy especially in light of his history of urethral stricture to ensure that this is not recurred    4. 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    Return in about 1 year (around 02/24/2024) for 24yr follow up w/ IPSS & PVR .  I have reviewed the above documentation for accuracy and completeness, and I agree with the above.   [name], MD   [facility] [address] [place], [place] [address] [phone]

## 2023-03-15 ENCOUNTER — Ambulatory Visit (INDEPENDENT_AMBULATORY_CARE_PROVIDER_SITE_OTHER): Payer: Medicare Other | Admitting: Urology

## 2023-03-15 VITALS — BP 119/63 | HR 60 | Ht 62.0 in | Wt 201.0 lb

## 2023-03-15 DIAGNOSIS — R300 Dysuria: Secondary | ICD-10-CM

## 2023-03-15 DIAGNOSIS — N390 Urinary tract infection, site not specified: Secondary | ICD-10-CM

## 2023-03-15 DIAGNOSIS — N3001 Acute cystitis with hematuria: Secondary | ICD-10-CM

## 2023-03-15 LAB — URINALYSIS, COMPLETE
Bilirubin, UA: NEGATIVE
Glucose, UA: NEGATIVE
Ketones, UA: NEGATIVE
Nitrite, UA: POSITIVE — AB
Specific Gravity, UA: 1.015 (ref 1.005–1.030)
Urobilinogen, Ur: 1 mg/dL (ref 0.2–1.0)
pH, UA: 6 (ref 5.0–7.5)

## 2023-03-15 LAB — MICROSCOPIC EXAMINATION: WBC, UA: >30 /hpf — AB (ref 0–5)

## 2023-03-15 MED ORDER — SULFAMETHOXAZOLE-TRIMETHOPRIM 800-160 MG PO TABS: 1.0000 | ORAL_TABLET | Freq: Two times a day (BID) | ORAL | 0 refills | Status: DC

## 2023-03-15 NOTE — Progress Notes (Signed)
Marcelle Overlie Plume,acting as a scribe for [name], MD.,have documented all relevant documentation on the behalf of [name], MD,as directed by  [name], MD while in the presence of [name], MD.  03/15/2023 11:01 AM   Jacob Sellers 18-Jul-1946 [id number]  Referring provider: Sandrea Hughs, NP [address] [place],  [place] [address]  Chief Complaint  Patient presents with   Dysuria    HPI: 77 year-old male who presents today complaining of urgency, frequency, and dysuria. Notably, he has a personal history of urinary tract infections with right testicular pain in January and February of this year. He also has a personal history of urethral stricture disease and BPH. At his last visit, we had recommended consideration of a cystoscopy if his urinary symptoms recurred.   His urinalysis today shows >30 WBC's, 11-30 RBC's, and many bacteria.   Symptoms began on Sunday, with the patient experiencing chills and a fever. By Monday, symptoms persisted, and he experienced episodes of incontinence. He reports no testicular pain or swelling at this time.     PMH: Past Medical History:  Diagnosis Date   Arthritis    Heartburn arthritis   HLD (hyperlipidemia)    Hypertension    Prostate enlargement     Surgical History: Past Surgical History:  Procedure Laterality Date   cyst removed Rt thigh Right    CYSTOSCOPY WITH URETHRAL DILATATION N/A 07/18/2016   Procedure: CYSTOSCOPY WITH URETHRAL DILATATION WITH FOLEY CATHETER PLACEMENT;  Surgeon: [name], MD;  Location: [facility] ORS;  Service: Urology;  Laterality: N/A;    Home Medications:  Allergies as of 03/15/2023   No Known Allergies      Medication List        Accurate as of Mar 15, 2023 11:01 AM. If you have any questions, ask your nurse or doctor.          atenolol 50 MG tablet Commonly known as: TENORMIN Take 50 mg by mouth daily.   chlorthalidone 50 MG tablet Commonly known as:  HYGROTON Take 50 mg by mouth daily.   doxazosin 4 MG tablet Commonly known as: CARDURA Take 4 mg by mouth daily. afternoon   finasteride 5 MG tablet Commonly known as: PROSCAR   losartan 100 MG tablet Commonly known as: COZAAR Take 100 mg by mouth daily.   simvastatin 80 MG tablet Commonly known as: ZOCOR Take 80 mg by mouth daily.   sulfamethoxazole-trimethoprim 800-160 MG tablet Commonly known as: BACTRIM DS Take 1 tablet by mouth 2 (two) times daily.        Family History: Family History  Problem Relation Age of Onset   Prostate cancer Neg Hx    Kidney disease Neg Hx     Social History:  reports that he quit smoking about 49 years ago. His smoking use included cigarettes. He has never used smokeless tobacco. He reports current alcohol use of about 6.0 standard drinks of alcohol per week. He reports that he does not use drugs.   Physical Exam: BP 119/63   Pulse 60   Ht 5\' 2"  (1.575 m)   Wt 201 lb (91.2 kg)   BMI 36.76 kg/m   Constitutional:  Alert and oriented, No acute distress. HEENT: [place] AT, moist mucus membranes.  Trachea midline, no masses. Neurologic: Grossly intact, no focal deficits, moving all 4 extremities. Psychiatric: Normal mood and affect.   Assessment & Plan:   1. Acute cystitis - Urinalysis was frankly positive today - Urine culture -  Bactrim DS BID x 10 days. - If his symptoms fail to improve, he will let us know  2. History of urethral stricture/ recurrent UTI - Recommended an anatomic evaluation to evaluate for recurrent stricture disease, especially in the setting of three documented infections over the past six months. He is agreeable with this plan.     Return for cystoscopy.   [facility] [address], [address] [place], [place] [address] [phone]

## 2023-03-21 LAB — CULTURE, URINE COMPREHENSIVE

## 2023-04-21 ENCOUNTER — Other Ambulatory Visit: Payer: Self-pay

## 2023-04-21 ENCOUNTER — Other Ambulatory Visit
Admission: RE | Admit: 2023-04-21 | Discharge: 2023-04-21 | Disposition: A | Discharge status: Home / Self Care | Payer: Medicare Other | Attending: Urology | Admitting: Urology

## 2023-04-21 ENCOUNTER — Other Ambulatory Visit: Payer: Self-pay | Admitting: Urology

## 2023-04-21 ENCOUNTER — Telehealth: Payer: Self-pay

## 2023-04-21 ENCOUNTER — Ambulatory Visit (INDEPENDENT_AMBULATORY_CARE_PROVIDER_SITE_OTHER): Payer: Medicare Other | Admitting: Urology

## 2023-04-21 VITALS — BP 147/72 | HR 58

## 2023-04-21 DIAGNOSIS — N390 Urinary tract infection, site not specified: Secondary | ICD-10-CM | POA: Diagnosis present

## 2023-04-21 DIAGNOSIS — N35919 Unspecified urethral stricture, male, unspecified site: Secondary | ICD-10-CM

## 2023-04-21 DIAGNOSIS — N35819 Other urethral stricture, male, unspecified site: Secondary | ICD-10-CM | POA: Diagnosis not present

## 2023-04-21 DIAGNOSIS — Z87440 Personal history of urinary (tract) infections: Secondary | ICD-10-CM

## 2023-04-21 LAB — URINALYSIS, COMPLETE (UACMP) WITH MICROSCOPIC
Bilirubin Urine: NEGATIVE
Glucose, UA: NEGATIVE mg/dL
Hgb urine dipstick: NEGATIVE
Ketones, ur: NEGATIVE mg/dL
Nitrite: NEGATIVE
Protein, ur: NEGATIVE mg/dL
Specific Gravity, Urine: 1.02 (ref 1.005–1.030)
pH: 7 (ref 5.0–8.0)

## 2023-04-21 MED ORDER — SULFAMETHOXAZOLE-TRIMETHOPRIM 800-160 MG PO TABS
1.0000 | ORAL_TABLET | Freq: Two times a day (BID) | ORAL | Status: DC
  Administered 2023-04-21: 1 via ORAL

## 2023-04-21 MED ORDER — SULFAMETHOXAZOLE-TRIMETHOPRIM 800-160 MG PO TABS
1.0000 | ORAL_TABLET | Freq: Once | ORAL | Status: AC
  Administered 2023-04-21: 1 via ORAL

## 2023-04-21 NOTE — Addendum Note (Signed)
Addended by: Consuella Lose on: 04/21/2023 03:06 PM   Modules accepted: Orders

## 2023-04-21 NOTE — Progress Notes (Signed)
Surgical Physician Order Form [facility]  * Scheduling expectation : Next Available  *Length of Case:   *Clearance needed: no  *Anticoagulation Instructions: Hold all anticoagulants  *Aspirin Instructions: Ok to continue Aspirin  *Post-op visit Date/Instructions:   2 days foley removal, 6 weeks ipss/ pvr  *Diagnosis:  urethral stricture  *Procedure: Cystoscopy, urethral dilation with Optilume balloon   Additional orders: N/A  -Admit type: OUTpatient  -Anesthesia: MAC  -VTE Prophylaxis Standing Order SCD's       Other:   -Standing Lab Orders Per Anesthesia    Lab other: None  -Standing Test orders EKG/Chest x-ray per Anesthesia       Test other:   - Medications:  Ancef 2gm IV  -Other orders: Please contact patient's son, Bradly Chris as primary contact

## 2023-04-21 NOTE — Progress Notes (Signed)
   04/21/23  CC:  Chief Complaint  Patient presents with   Cysto    HPI: 77 year old male with history of urethral stricture disease and recurrent UTIs who presents today for cystoscopy.  Please see previous notes for details.  Accompanied today by Spanish interpreter.  Blood pressure (!) 147/72, pulse (!) 58. NED. A&Ox3.   No respiratory distress   Abd soft, NT, ND Normal external genitalia with patent urethral meatus  Cystoscopy Procedure Note  Patient identification was confirmed, informed consent was obtained, and patient was prepped using Betadine solution.  Lidocaine jelly was administered per urethral meatus.    Procedure: - Flexible cystoscope introduced, without any difficulty.   - Thorough search of the bladder revealed:    Fairly dense but short appearing urethral stricture within the pendulous urethra, approximately 5 French today in diameter.  Unable to advance scope beyond this.  Post-Procedure: - Patient tolerated the procedure well  Single dose of Bactrim was given today.  He does have some leukocytes in his urine today but is otherwise asymptomatic.  Assessment/ Plan:  1.  Pendulous urethral stricture-contributing factor to his urinary tract infections.  I recommended proceeding to the operating room for cystoscopy, Optilume balloon urethral dilation.  We discussed that he will catheter for least 2 days postop.  Risk and benefits of the procedure including risk of bleeding, infection, damage surrounding structures, need for Foley catheter amongst others were discussed.  Will discussed the risk of recurrence.  He is agreeable with this plan.  He asked that we call Luster Landsberg his son to coordinate the procedure.  Will send for urine culture today.   [name], MD

## 2023-04-21 NOTE — Progress Notes (Signed)
   [facility] Surgical Posting Form  Surgery Date: Date: 05/29/2023  Surgeon: Dr. [name], MD  Inpt ( No  )   Outpt (Yes)   Obs ( No  )   Diagnosis: N35.919 Urethral Stricture  -CPT: 52000, 16109  Surgery: Diagnostic Cystoscopy with Urethral Balloon Dilation using Optilume  Stop Anticoagulations: Yes, may continue ASA  Cardiac/Medical/Pulmonary Clearance needed: no  *Orders entered into EPIC  Date: 04/21/23   *Case booked in EPIC  Date: 04/21/23  *Notified pt of Surgery: Date: 04/21/23  PRE-OP UA & CX: no  *Placed into Prior Authorization Work [place] Date: 04/21/23  Assistant/laser/rep:No

## 2023-04-21 NOTE — Telephone Encounter (Signed)
I spoke with Bradly Chris (Son of patient). We have discussed possible surgery dates and Monday July 22nd, 2024 was agreed upon by all parties. Patient given information about surgery date, what to expect pre-operatively and post operatively.  We discussed that a Pre-Admission Testing office will be calling to set up the pre-op visit that will take place prior to surgery, and that these appointments are typically done over the phone with a Pre-Admissions RN. Informed patient that our office will communicate any additional care to be provided after surgery. Patients questions or concerns were discussed during our call. Advised to call our office should there be any additional information, questions or concerns that arise. Patient verbalized understanding.

## 2023-04-24 LAB — URINE CULTURE: Culture: >=100000 — AB

## 2023-04-26 ENCOUNTER — Other Ambulatory Visit: Payer: Self-pay

## 2023-04-26 DIAGNOSIS — B9620 Unspecified Escherichia coli [E. coli] as the cause of diseases classified elsewhere: Secondary | ICD-10-CM

## 2023-04-26 MED ORDER — SULFAMETHOXAZOLE-TRIMETHOPRIM 800-160 MG PO TABS: 1.0000 | ORAL_TABLET | Freq: Two times a day (BID) | ORAL | 0 refills | Status: DC

## 2023-05-19 ENCOUNTER — Other Ambulatory Visit: Payer: Self-pay

## 2023-05-19 ENCOUNTER — Encounter
Admission: RE | Admit: 2023-05-19 | Discharge: 2023-05-19 | Disposition: A | Discharge status: Home / Self Care | Payer: Medicare Other | Source: Ambulatory Visit | Attending: Urology | Admitting: Urology

## 2023-05-19 VITALS — Ht 62.0 in | Wt 200.0 lb

## 2023-05-19 DIAGNOSIS — R7303 Prediabetes: Secondary | ICD-10-CM

## 2023-05-19 DIAGNOSIS — I10 Essential (primary) hypertension: Secondary | ICD-10-CM

## 2023-05-19 HISTORY — DX: Pain in right shoulder: M25.511

## 2023-05-19 HISTORY — DX: Bilateral primary osteoarthritis of knee: M17.0

## 2023-05-19 HISTORY — DX: Prediabetes: R73.03

## 2023-05-19 HISTORY — DX: Unspecified rotator cuff tear or rupture of unspecified shoulder, not specified as traumatic: M75.100

## 2023-05-19 NOTE — Patient Instructions (Addendum)
Su procedimiento est programado para: Lunes 22 de Julio del 2024. Presntese en el mostrador de Tax adviser del [employer] Para saber su hora de llegada, llame al [phone] entre la 1:00 p. m. y las 3:00 p. m. en: Viernes 19 de Julio del 2024. Si su hora de llegada es a las 6:00 am, no llegue antes de esa hora ya que las puertas de [place] [facility] no se abren Teacher, adult education las 6:00 am.  RECORDAR: Las instrucciones que no se siguen completamente pueden provocar riesgos mdicos graves, que pueden llegar hasta la [place]; o, segn el criterio de su cirujano y Scientific laboratory technician, es posible que sea Aeronautical engineer su Leisure centre manager.  No ingiera alimentos ni bebidas despus de la medianoche del da anterior a la ciruga. No mascar chicle ni caramelos duros.  Una semana antes de la ciruga: Detenga los antiinflamatorios (AINE) como Advil, Aleve, Ibuprofeno, Motrin, Naproxen, Naprosyn y productos a base de aspirina como Excedrin, Goody's Powder, BC Powder. Suspenda CUALQUIER suplemento de venta libre hasta despus de la [place]. Omega 3 1000 MG  Sin embargo, puede continuar tomando Tylenol si es necesario para Marketing executive de la [place].  Contine tomando todos los medicamentos recetados, excepto los siguientes:  Siga las recomendaciones del cardilogo o PCP con respecto a suspender los anticoagulantes.  TOME SLO ESTOS MEDICAMENTOS LA MAANA DE LA CIRUGA CON UN SORBO DE AGUA:  atenolol (TENORMIN) 50 MG  sulfamethoxazole-trimethoprim (BACTRIM DS) 800-160 MG   No consumir alcohol durante 24 horas antes o despus de la [place].  No fumar, incluidos los cigarrillos electrnicos, durante las 24 horas previas a la [place]. No consumir productos de tabaco masticables durante al menos 6 horas antes de la [place]. Sin parches de Optometrist de la [place].  No use ningn medicamento "recreativo" durante al menos una semana (preferiblemente 2 semanas) antes de la  ciruga. Tenga en cuenta que la combinacin de cocana y anestesia puede [employer], que pueden llegar hasta la [place]. Si su prueba de cocana da positivo, su ciruga ser cancelada.  La maana de la ciruga cepille sus dientes con pasta dental y agua, puede enjuagarse la boca con enjuague bucal si lo desea. No ingiera pasta de dientes ni enjuague bucal.  Utilice jabn o toallitas CHG como se indica en la hoja de instrucciones.  No use joyas, maquillaje, horquillas, clips ni esmalte de uas.  No use lociones, polvos ni perfumes.  No se afeite el vello corporal desde el cuello hacia abajo 48 horas antes de la [place].  No se pueden usar lentes de contacto, audfonos ni dentaduras postizas durante la [place].  No lleve objetos de valor al hospital. [facility] no es responsable de ninguna pertenencia u objeto de valor perdido o perdido.  Notifique a su mdico si hay algn cambio en su condicin mdica (resfriado, fiebre, infeccin).  Lleve ropa cmoda (especfica para su tipo de [place]) al hospital.  Despus de la ciruga, usted puede ayudar a prevenir complicaciones pulmonares haciendo ejercicios de respiracin. Respire profundamente y tosa cada 1 o 2 horas. Su mdico puede indicarle un dispositivo llamado espirmetro incentivador para ayudarle a respirar profundamente. Al toser o Engineering geologist, sostenga firmemente una almohada contra la incisin con ambas manos. Esto se llama "ferulizacin". Hacer esto ayuda a proteger su incisin. Tambin disminuye las molestias abdominales.  Si vas a pasar la noche en el hospital, deja tu maleta en el coche. Despus de la [place], es posible que lo  lleven a su habitacin.  En caso de un mayor censo de [place], puede ser necesario que usted, el [place], contine con su atencin posoperatoria en el departamento de [place] el [place].  Si le dan el alta el da de la [place], no se le permitir conducir a casa. Necesitar que una  persona responsable lo lleve a su casa y se quede con usted durante las 24 horas posteriores a la [place].  Si viaja en transporte pblico, deber ir acompaado de una persona responsable.  Llame al Departamento de pruebas previas a la admisin al [phone] si tiene alguna pregunta sobre estas instrucciones.  Poltica de visitas a ciruga:  [employer] se someten a [place] o procedimiento pueden [employer] familiares o personas de apoyo con ellos, siempre y cuando la persona no sea positiva para COVID-19 ni experimente sus sntomas.  Visitas para pacientes hospitalizados:  El horario de visita es de 7 a 20 horas. Se permiten hasta cuatro visitantes a la vez en la habitacin de un paciente. Los visitantes podrn rotar con Garment/textile technologist. Neomia Dear persona de apoyo designada (adulto) podr pasar la noche.  Debido a un aumento en las tasas de VSR e influenza y las hospitalizaciones asociadas, los nios menores de 12 aos no podrn visitar a los pacientes en los hospitales de [employer]. Se siguen recomendando encarecidamente las mascarillas. Preparacin para la ciruga con jabn de GLUCONATO DE CLORHEXIDINA (CHG)  Jabn de gluconato de clorhexidina (CHG)  o Un limpiador antisptico que [employer] grmenes y se adhiere a la piel para seguir [employer] grmenes incluso despus del lavado.  o Se utiliza para ducharse la noche anterior a la [place] y la maana de la [place].  Antes de la [place], usted puede desempear un papel importante al reducir la cantidad de grmenes en su piel. El jabn CHG (gluconato de clorhexidina) es un limpiador antisptico que mata los grmenes y se adhiere a la piel para continuar matndolos incluso despus del lavado.  No lo utilice si es alrgico al CHG o a los jabones antibacterianos. Si su piel se enrojece o irrita, deje de usar CHG.  1. Ducharse la NOCHE ANTES DE LA CIRUGA y la [place] DE LA CIRUGA con jabn CHG.  2. Si eliges  lavarte el cabello, lvalo primero como de costumbre con tu champ habitual.  3. Despus del champ, enjuague bien el cabello y el cuerpo para eliminar el champ.  4. Utilice CHG como lo hara con cualquier otro jabn lquido. Puede aplicar CHG directamente sobre la piel y lavar suavemente con un pauelo o una toallita limpia.  5. Aplique el jabn CHG en su cuerpo nicamente desde el cuello hacia abajo. No utilizar en heridas abiertas o llagas abiertas. Evite el contacto con los ojos, odos, boca y genitales (partes privadas). Lvese la cara y los genitales (partes privadas) con su jabn habitual.  6. Lvese bien, prestando especial atencin al rea donde se realizar su ciruga.  7. Enjuague bien su cuerpo con agua tibia.  8. No se duche ni se lave con su jabn normal despus de usar y enjuagar el jabn CHG.  9. Squese dando palmaditas con una toalla limpia.  10. Use pijamas limpios para dormir la noche anterior a la ciruga.  12. Coloque sbanas limpias en su cama la noche de su primera ducha y no duerma con mascotas.  13. Ducharse nuevamente con el jabn CHG el da de la ciruga antes de llegar al hospital.  14. No aplique desodorantes, lociones o polvos.  15. Por favor use ropa limpia al hospital.

## 2023-05-22 ENCOUNTER — Encounter
Admission: RE | Admit: 2023-05-22 | Discharge: 2023-05-22 | Disposition: A | Discharge status: Home / Self Care | Payer: Medicare Other | Source: Ambulatory Visit | Attending: Urology | Admitting: Urology

## 2023-05-22 DIAGNOSIS — E119 Type 2 diabetes mellitus without complications: Secondary | ICD-10-CM | POA: Diagnosis not present

## 2023-05-22 DIAGNOSIS — I10 Essential (primary) hypertension: Secondary | ICD-10-CM | POA: Diagnosis present

## 2023-05-22 DIAGNOSIS — Z01810 Encounter for preprocedural cardiovascular examination: Secondary | ICD-10-CM | POA: Diagnosis not present

## 2023-05-22 DIAGNOSIS — R7303 Prediabetes: Secondary | ICD-10-CM

## 2023-05-22 LAB — CBC
HCT: 38.4 % — ABNORMAL LOW (ref 39.0–52.0)
Hemoglobin: 12.6 g/dL — ABNORMAL LOW (ref 13.0–17.0)
MCH: 31.6 pg (ref 26.0–34.0)
MCHC: 32.8 g/dL (ref 30.0–36.0)
MCV: 96.2 fL (ref 80.0–100.0)
Platelets: 166 10*3/uL (ref 150–400)
RBC: 3.99 MIL/uL — ABNORMAL LOW (ref 4.22–5.81)
RDW: 12.9 % (ref 11.5–15.5)
WBC: 7.3 10*3/uL (ref 4.0–10.5)
nRBC: 0 % (ref 0.0–0.2)

## 2023-05-22 LAB — BASIC METABOLIC PANEL
Anion gap: 5 (ref 5–15)
BUN: 39 mg/dL — ABNORMAL HIGH (ref 8–23)
CO2: 31 mmol/L (ref 22–32)
Calcium: 9.3 mg/dL (ref 8.9–10.3)
Chloride: 102 mmol/L (ref 98–111)
Creatinine, Ser: 1.55 mg/dL — ABNORMAL HIGH (ref 0.61–1.24)
GFR, Estimated: 46 mL/min — ABNORMAL LOW (ref >60)
Glucose, Bld: 114 mg/dL — ABNORMAL HIGH (ref 70–99)
Potassium: 3.7 mmol/L (ref 3.5–5.1)
Sodium: 138 mmol/L (ref 135–145)

## 2023-05-26 ENCOUNTER — Ambulatory Visit: Payer: Medicare Other | Admitting: Urology

## 2023-05-29 ENCOUNTER — Ambulatory Visit: Payer: Medicare Other | Admitting: Urgent Care

## 2023-05-29 ENCOUNTER — Ambulatory Visit: Payer: Medicare Other | Admitting: General Practice

## 2023-05-29 ENCOUNTER — Ambulatory Visit
Admission: RE | Admit: 2023-05-29 | Discharge: 2023-05-29 | Disposition: A | Discharge status: Home / Self Care | Payer: Medicare Other | Attending: Urology | Admitting: Urology

## 2023-05-29 ENCOUNTER — Other Ambulatory Visit: Payer: Self-pay

## 2023-05-29 ENCOUNTER — Encounter: Payer: Self-pay | Admitting: Urology

## 2023-05-29 ENCOUNTER — Encounter
Admission: RE | Disposition: A | Discharge status: Home / Self Care | Payer: Self-pay | Source: Home / Self Care | Attending: Urology

## 2023-05-29 DIAGNOSIS — Z87440 Personal history of urinary (tract) infections: Secondary | ICD-10-CM

## 2023-05-29 DIAGNOSIS — N35912 Unspecified bulbous urethral stricture, male: Secondary | ICD-10-CM | POA: Diagnosis not present

## 2023-05-29 DIAGNOSIS — Z87891 Personal history of nicotine dependence: Secondary | ICD-10-CM | POA: Diagnosis not present

## 2023-05-29 DIAGNOSIS — N35919 Unspecified urethral stricture, male, unspecified site: Secondary | ICD-10-CM

## 2023-05-29 DIAGNOSIS — I10 Essential (primary) hypertension: Secondary | ICD-10-CM | POA: Insufficient documentation

## 2023-05-29 HISTORY — PX: CYSTOSCOPY: SHX5120

## 2023-05-29 HISTORY — PX: CYSTOSCOPY WITH URETHRAL DILATATION: SHX5125

## 2023-05-29 SURGERY — CYSTOSCOPY
Anesthesia: General

## 2023-05-29 MED ORDER — DEXAMETHASONE SODIUM PHOSPHATE 10 MG/ML IJ SOLN
INTRAMUSCULAR | Status: AC
  Filled 2023-05-29: qty 1

## 2023-05-29 MED ORDER — PROPOFOL 10 MG/ML IV BOLUS
INTRAVENOUS | Status: AC
  Filled 2023-05-29: qty 20

## 2023-05-29 MED ORDER — OXYCODONE HCL 5 MG PO TABS
5.0000 mg | ORAL_TABLET | Freq: Once | ORAL | Status: AC | PRN
  Administered 2023-05-29: 5 mg via ORAL

## 2023-05-29 MED ORDER — LIDOCAINE HCL (CARDIAC) PF 100 MG/5ML IV SOSY
PREFILLED_SYRINGE | INTRAVENOUS | Status: DC | PRN
  Administered 2023-05-29: 40 mg via INTRATRACHEAL

## 2023-05-29 MED ORDER — CHLORHEXIDINE GLUCONATE 0.12 % MT SOLN
OROMUCOSAL | Status: AC
  Filled 2023-05-29: qty 15

## 2023-05-29 MED ORDER — EPHEDRINE SULFATE (PRESSORS) 50 MG/ML IJ SOLN
INTRAMUSCULAR | Status: DC | PRN
  Administered 2023-05-29 (×3): 5 mg via INTRAVENOUS

## 2023-05-29 MED ORDER — SODIUM CHLORIDE 0.9 % IR SOLN
Status: DC | PRN
  Administered 2023-05-29: 1 via INTRAVESICAL

## 2023-05-29 MED ORDER — CEFAZOLIN SODIUM-DEXTROSE 2-4 GM/100ML-% IV SOLN
2.0000 g | INTRAVENOUS | Status: AC
  Administered 2023-05-29: 2 g via INTRAVENOUS

## 2023-05-29 MED ORDER — PROPOFOL 500 MG/50ML IV EMUL
INTRAVENOUS | Status: DC | PRN
  Administered 2023-05-29: 75 ug/kg/min via INTRAVENOUS

## 2023-05-29 MED ORDER — ORAL CARE MOUTH RINSE: 15.0000 mL | Freq: Once | OROMUCOSAL | Status: AC

## 2023-05-29 MED ORDER — MIDAZOLAM HCL 2 MG/2ML IJ SOLN
INTRAMUSCULAR | Status: DC | PRN
  Administered 2023-05-29: 1 mg via INTRAVENOUS

## 2023-05-29 MED ORDER — CHLORHEXIDINE GLUCONATE 0.12 % MT SOLN
15.0000 mL | Freq: Once | OROMUCOSAL | Status: AC
  Administered 2023-05-29: 15 mL via OROMUCOSAL

## 2023-05-29 MED ORDER — FAMOTIDINE 20 MG PO TABS
20.0000 mg | ORAL_TABLET | Freq: Once | ORAL | Status: AC
  Administered 2023-05-29: 20 mg via ORAL

## 2023-05-29 MED ORDER — OXYCODONE HCL 5 MG PO TABS
ORAL_TABLET | ORAL | Status: AC
  Filled 2023-05-29: qty 1

## 2023-05-29 MED ORDER — PROPOFOL 10 MG/ML IV BOLUS
INTRAVENOUS | Status: DC | PRN
Start: 2023-05-29 — End: 2023-05-29
  Administered 2023-05-29: 50 mg via INTRAVENOUS

## 2023-05-29 MED ORDER — CEFAZOLIN SODIUM-DEXTROSE 2-4 GM/100ML-% IV SOLN
INTRAVENOUS | Status: AC
  Filled 2023-05-29: qty 100

## 2023-05-29 MED ORDER — FAMOTIDINE 20 MG PO TABS
ORAL_TABLET | ORAL | Status: AC
  Filled 2023-05-29: qty 1

## 2023-05-29 MED ORDER — LACTATED RINGERS IV SOLN: INTRAVENOUS | Status: DC

## 2023-05-29 MED ORDER — ROCURONIUM BROMIDE 10 MG/ML (PF) SYRINGE
PREFILLED_SYRINGE | INTRAVENOUS | Status: AC
  Filled 2023-05-29: qty 10

## 2023-05-29 MED ORDER — OXYCODONE HCL 5 MG/5ML PO SOLN: 5.0000 mg | Freq: Once | ORAL | Status: AC | PRN

## 2023-05-29 MED ORDER — LIDOCAINE HCL (PF) 2 % IJ SOLN
INTRAMUSCULAR | Status: AC
  Filled 2023-05-29: qty 5

## 2023-05-29 MED ORDER — FENTANYL CITRATE (PF) 100 MCG/2ML IJ SOLN: 25.0000 ug | INTRAMUSCULAR | Status: DC | PRN

## 2023-05-29 MED ORDER — FENTANYL CITRATE (PF) 100 MCG/2ML IJ SOLN
INTRAMUSCULAR | Status: DC | PRN
  Administered 2023-05-29 (×2): 25 ug via INTRAVENOUS
  Administered 2023-05-29: 50 ug via INTRAVENOUS

## 2023-05-29 MED ORDER — FENTANYL CITRATE (PF) 100 MCG/2ML IJ SOLN
INTRAMUSCULAR | Status: AC
  Filled 2023-05-29: qty 2

## 2023-05-29 MED ORDER — MIDAZOLAM HCL 2 MG/2ML IJ SOLN
INTRAMUSCULAR | Status: AC
  Filled 2023-05-29: qty 2

## 2023-05-29 SURGICAL SUPPLY — 23 items
BAG DRAIN SIEMENS DORNER NS (MISCELLANEOUS) ×1 IMPLANT
BAG DRN NS LF (MISCELLANEOUS) ×1
BAG DRN RND TRDRP ANRFLXCHMBR (UROLOGICAL SUPPLIES) ×1
BAG URINE DRAIN 2000ML AR STRL (UROLOGICAL SUPPLIES) ×1 IMPLANT
BALLN OPTILUME DCB 30X5X75 (BALLOONS) ×1
BALLOON OPTILUME DCB 30X5X75 (BALLOONS) IMPLANT
BRUSH SCRUB EZ 1% IODOPHOR (MISCELLANEOUS) ×1 IMPLANT
CATH FOLEY 2W COUNCIL 20FR 5CC (CATHETERS) IMPLANT
CATH URETHRAL DIL 7.0X29 (CATHETERS) ×1 IMPLANT
CATH URETL OPEN 5X70 (CATHETERS) ×1 IMPLANT
DEVICE INFLATION ATRION QL4015 (MISCELLANEOUS) ×1 IMPLANT
GLOVE BIO SURGEON STRL SZ 6.5 (GLOVE) ×1 IMPLANT
GOWN STRL REUS W/ TWL LRG LVL3 (GOWN DISPOSABLE) ×2 IMPLANT
GOWN STRL REUS W/TWL LRG LVL3 (GOWN DISPOSABLE) ×2
GUIDEWIRE STR DUAL SENSOR (WIRE) ×2 IMPLANT
IV NS IRRIG 3000ML ARTHROMATIC (IV SOLUTION) ×1 IMPLANT
PACK CYSTO AR (MISCELLANEOUS) ×1 IMPLANT
SET CYSTO W/LG BORE CLAMP LF (SET/KITS/TRAYS/PACK) ×1 IMPLANT
SURGILUBE 2OZ TUBE FLIPTOP (MISCELLANEOUS) ×1 IMPLANT
SYR 10ML LL (SYRINGE) ×1 IMPLANT
SYR 30ML LL (SYRINGE) ×1 IMPLANT
WATER STERILE IRR 3000ML UROMA (IV SOLUTION) ×1 IMPLANT
WATER STERILE IRR 500ML POUR (IV SOLUTION) ×1 IMPLANT

## 2023-05-29 NOTE — H&P (Signed)
05/29/23  RRR CTAB  CC:     Chief Complaint  Patient presents with   Cysto      HPI: 77 year old male with history of urethral stricture disease and recurrent UTIs who presents today for cystoscopy.  Please see previous notes for details.  Accompanied today by Spanish interpreter.   Blood pressure (!) 147/72, pulse (!) 58. NED. A&Ox3.   No respiratory distress   Abd soft, NT, ND Normal external genitalia with patent urethral meatus   Cystoscopy Procedure Note   Patient identification was confirmed, informed consent was obtained, and patient was prepped using Betadine solution.  Lidocaine jelly was administered per urethral meatus.     Procedure: - Flexible cystoscope introduced, without any difficulty.   - Thorough search of the bladder revealed:    Fairly dense but short appearing urethral stricture within the pendulous urethra, approximately 5 French today in diameter.  Unable to advance scope beyond this.   Post-Procedure: - Patient tolerated the procedure well   Single dose of Bactrim was given today.  He does have some leukocytes in his urine today but is otherwise asymptomatic.   Assessment/ Plan:   1.  Pendulous urethral stricture-contributing factor to his urinary tract infections.  I recommended proceeding to the operating room for cystoscopy, Optilume balloon urethral dilation.  We discussed that he will catheter for least 2 days postop.  Risk and benefits of the procedure including risk of bleeding, infection, damage surrounding structures, need for Foley catheter amongst others were discussed.  Will discussed the risk of recurrence.  He is agreeable with this plan.  He asked that we call Luster Landsberg his son to coordinate the procedure.   Will send for urine culture today.

## 2023-05-29 NOTE — Op Note (Signed)
Date of procedure: 05/29/23  Preoperative diagnosis:  Bulbar urethral stricture Recurrent UTIs  Postoperative diagnosis:  Same as above  Procedure: Cystoscopy with urethral balloon dilation (Optilume balloon)  Surgeon: [name], MD  Anesthesia: MAC  Complications: None  Intraoperative findings: 5 French bulbar urethral stricture, approximately 1 cm in length  EBL: Minimal  Specimens: None  Drains: 20 French council tip catheter  Indication: Jacob Sellers is a 77 y.o. patient with personal history of bulbar urethral stricture in 2017 with recurrent UTIs found to have recurrence.  After reviewing the management options for treatment, he elected to proceed with the above surgical procedure(s). We have discussed the potential benefits and risks of the procedure, side effects of the proposed treatment, the likelihood of the patient achieving the goals of the procedure, and any potential problems that might occur during the procedure or recuperation. Informed consent has been obtained.  Description of procedure:  The patient was taken to the operating room and general anesthesia was induced.  The patient was placed in the dorsal lithotomy position, prepped and draped in the usual sterile fashion, and preoperative antibiotics were administered. A preoperative time-out was performed.   A 21 French scope was advanced to the bulbar urethral area where a stricture, approximately 5 [place] was noted.  The wire was then placed through the strictured area.  Notably, you could see beyond the stricture which is approximately centimeter in length.  It was too dense to pass over the wire and as such, I used a Cook urethral balloon over the wire to gently dilate the stricture in question.  I was then able to pass the scope easily through the strictured area alongside of the safety wire into the bladder.  The bladder was inspected.  There was some mild trabeculation otherwise unremarkable.  The  scope was then removed.  A Optilume balloon, 30 [place] and 5 cm in length was placed across the strictured area in the bulbar urethra.  The balloon was then filled with 10 atm of pressure and allowed to dwell for 5 minutes.  At the end of 5 minutes, the balloon was removed and a council tip catheter, 20 [place] was advanced over the wire easily into the bladder.  The balloon was filled with 10 cc of sterile water.  The patient was then cleaned and dried, repositioned in the supine position and taken to PACU in stable condition.  Plan: He will maintain the Foley catheter for 2 to 3 days.  He will return for symptom recheck possibly urinalysis in 6 weeks.  [name], M.D.

## 2023-05-29 NOTE — Anesthesia Preprocedure Evaluation (Signed)
Anesthesia Evaluation  Patient identified by MRN, date of birth, ID band Patient awake    Reviewed: Allergy & Precautions, H&P , NPO status , Patient's Chart, lab work & pertinent test results, reviewed documented beta blocker date and time   Airway Mallampati: II  TM Distance: >3 FB Neck ROM: full    Dental  (+) Teeth Intact, Dental Advidsory Given   Pulmonary neg pulmonary ROS, former smoker   Pulmonary exam normal        Cardiovascular Exercise Tolerance: Good hypertension, (-) angina negative cardio ROS Normal cardiovascular exam(-) dysrhythmias  Rate:Normal     Neuro/Psych negative neurological ROS  negative psych ROS   GI/Hepatic negative GI ROS, Neg liver ROS,,,  Endo/Other  negative endocrine ROS    Renal/GU negative Renal ROS  negative genitourinary   Musculoskeletal   Abdominal   Peds  Hematology negative hematology ROS (+)   Anesthesia Other Findings Past Medical History: No date: Arthritis No date: Bilateral primary osteoarthritis of knee arthritis: Heartburn No date: HLD (hyperlipidemia) No date: Hypertension No date: Pre-diabetes No date: Prostate enlargement No date: Rotator cuff tear No date: Shoulder pain, bilateral  Past Surgical History: No date: cyst removed Rt thigh; Right 07/18/2016: CYSTOSCOPY WITH URETHRAL DILATATION; N/A     Comment:  Procedure: CYSTOSCOPY WITH URETHRAL DILATATION WITH               FOLEY CATHETER PLACEMENT;  Surgeon: [name], MD;                Location: [facility];  Service: Urology;  Laterality: N/A;  BMI    Body Mass Index: 36.58 kg/m      Reproductive/Obstetrics negative OB ROS                             Anesthesia Physical Anesthesia Plan  ASA: III  Anesthesia Plan: General LMA and General   Post-op Pain Management:    Induction: Intravenous  PONV Risk Score and Plan: Ondansetron, Dexamethasone, Propofol infusion,  TIVA and Midazolam  Airway Management Planned: Natural Airway and Nasal Cannula  Additional Equipment:   Intra-op Plan:   Post-operative Plan:   Informed Consent: I have reviewed the patients History and Physical, chart, labs and discussed the procedure including the risks, benefits and alternatives for the proposed anesthesia with the patient or authorized representative who has indicated his/her understanding and acceptance.     Dental Advisory Given  Plan Discussed with: CRNA  Anesthesia Plan Comments: (Patient consented for risks of anesthesia including but not limited to:  - adverse reactions to medications - risk of airway placement if required - damage to eyes, teeth, lips or other oral mucosa - nerve damage due to positioning  - sore throat or hoarseness - Damage to heart, brain, nerves, lungs, other parts of body or loss of life  Patient voiced understanding.)        Anesthesia Quick Evaluation

## 2023-05-29 NOTE — Anesthesia Postprocedure Evaluation (Signed)
Anesthesia Post Note  Patient: Jacob Sellers  Procedure(s) Performed: DIAGNOSTIC CYSTOSCOPY CYSTOSCOPY WITH URETHRAL BALLOON DILATATION USING OPTILUME  Patient location during evaluation: PACU Anesthesia Type: General Level of consciousness: awake and alert Pain management: pain level controlled Vital Signs Assessment: post-procedure vital signs reviewed and stable Respiratory status: spontaneous breathing, nonlabored ventilation, respiratory function stable and patient connected to nasal cannula oxygen Cardiovascular status: blood pressure returned to baseline and stable Postop Assessment: no apparent nausea or vomiting Anesthetic complications: no  No notable events documented.   Last Vitals:  Vitals:   05/29/23 1330 05/29/23 1425  BP: 129/64 (!) 121/93  Pulse: (!) 51 (!) 53  Resp: 14 16  Temp: 36.6 C   SpO2: 100% 97%    Last Pain:  Vitals:   05/29/23 1425  TempSrc:   PainSc: 0-No pain                 Stephanie Coup

## 2023-05-29 NOTE — Progress Notes (Signed)
   05/29/23 1200  Spiritual Encounters  Type of Visit Initial  Care provided to: Patient  Referral source Chaplain assessment  Reason for visit Routine spiritual support  OnCall Visit No  Spiritual Framework  Presenting Themes Meaning/purpose/sources of inspiration  Interventions  Spiritual Care Interventions Made Established relationship of care and support;Reflective listening;Prayer  Intervention Outcomes  Outcomes Connection to spiritual care  Spiritual Care Plan  Spiritual Care Issues Still Outstanding No further spiritual care needs at this time (see row info)   Chaplain visited with patient and prayed. Sat bedside with patient for comfort. Chaplain spiritual support services remain available as the need arises.

## 2023-05-29 NOTE — Anesthesia Procedure Notes (Signed)
Procedure Name: MAC Date/Time: 05/29/2023 12:12 PM  Performed by: Elisabeth Pigeon, CRNAPre-anesthesia Checklist: Patient identified, Emergency Drugs available, Suction available, Patient being monitored and Timeout performed Patient Re-evaluated:Patient Re-evaluated prior to induction Oxygen Delivery Method: Simple face mask Induction Type: IV induction

## 2023-05-29 NOTE — Transfer of Care (Signed)
Immediate Anesthesia Transfer of Care Note  Patient: Jacob Sellers  Procedure(s) Performed: DIAGNOSTIC CYSTOSCOPY CYSTOSCOPY WITH URETHRAL BALLOON DILATATION USING OPTILUME  Patient Location: PACU  Anesthesia Type:MAC  Level of Consciousness: awake  Airway & Oxygen Therapy: Patient Spontanous Breathing  Post-op Assessment: Report given to RN and Post -op Vital signs reviewed and stable  Post vital signs: Reviewed and stable  Last Vitals:  Vitals Value Taken Time  BP 104/54 05/29/23 1244  Temp    Pulse 58 05/29/23 1247  Resp 15 05/29/23 1247  SpO2 100 % 05/29/23 1247  Vitals shown include unfiled device data.  Last Pain:  Vitals:   05/29/23 1037  TempSrc: Temporal         Complications: No notable events documented.

## 2023-05-30 ENCOUNTER — Encounter: Payer: Self-pay | Admitting: Urology

## 2023-05-31 ENCOUNTER — Ambulatory Visit (INDEPENDENT_AMBULATORY_CARE_PROVIDER_SITE_OTHER): Payer: Medicare Other | Admitting: Physician Assistant

## 2023-05-31 ENCOUNTER — Encounter: Payer: Self-pay | Admitting: Physician Assistant

## 2023-05-31 VITALS — BP 121/65 | HR 60 | Temp 98.2°F | Ht 62.0 in | Wt 192.7 lb

## 2023-05-31 DIAGNOSIS — Z466 Encounter for fitting and adjustment of urinary device: Secondary | ICD-10-CM

## 2023-05-31 DIAGNOSIS — N35919 Unspecified urethral stricture, male, unspecified site: Secondary | ICD-10-CM

## 2023-05-31 NOTE — Progress Notes (Signed)
Catheter Removal  Patient is present today for a catheter removal.  10ml of water was drained from the balloon. A 16FR foley cath was removed from the bladder, no complications were noted. Patient tolerated well.  Performed by: Fredirick Maudlin  Follow up/ Additional notes: No follow-ups on file.

## 2023-07-11 ENCOUNTER — Encounter: Payer: Medicare Other | Admitting: Urology

## 2023-08-02 ENCOUNTER — Ambulatory Visit: Payer: Medicare Other | Admitting: Urology

## 2023-08-02 VITALS — BP 160/80 | HR 56 | Ht 62.0 in | Wt 195.0 lb

## 2023-08-02 DIAGNOSIS — N390 Urinary tract infection, site not specified: Secondary | ICD-10-CM

## 2023-08-02 DIAGNOSIS — Z87440 Personal history of urinary (tract) infections: Secondary | ICD-10-CM

## 2023-08-02 DIAGNOSIS — N35812 Other urethral bulbous stricture, male: Secondary | ICD-10-CM | POA: Diagnosis not present

## 2023-08-02 DIAGNOSIS — N401 Enlarged prostate with lower urinary tract symptoms: Secondary | ICD-10-CM

## 2023-08-02 DIAGNOSIS — R351 Nocturia: Secondary | ICD-10-CM | POA: Diagnosis not present

## 2023-08-02 LAB — URINALYSIS, COMPLETE
Bilirubin, UA: NEGATIVE
Glucose, UA: NEGATIVE
Ketones, UA: NEGATIVE
Leukocytes,UA: NEGATIVE
Nitrite, UA: NEGATIVE
Protein,UA: NEGATIVE
RBC, UA: NEGATIVE
Specific Gravity, UA: 1.02 (ref 1.005–1.030)
Urobilinogen, Ur: 0.2 mg/dL (ref 0.2–1.0)
pH, UA: 5 (ref 5.0–7.5)

## 2023-08-02 LAB — MICROSCOPIC EXAMINATION

## 2023-08-02 LAB — BLADDER SCAN AMB NON-IMAGING: Scan Result: 36

## 2023-08-02 NOTE — Progress Notes (Signed)
Jacob Sellers,acting as a scribe for Jacob Scotland, MD.,have documented all relevant documentation on the behalf of Jacob Scotland, MD,as directed by  Jacob Scotland, MD while in the presence of Jacob Scotland, MD.  08/02/2023 4:40 PM   [name] 1946/03/26 [id number]  Referring provider: Sandrea Hughs, NP [address] [place],  [place] [address]  Chief Complaint  Patient presents with   Post-op Follow-up    HPI: 77 year-old male with a personal history of bulbar urethral stricture and recurrent UTI's who underwent urethral dilation with OptiLume.He returns today for routine follow up 6 weeks after the procedure.   Accompanied today by his son.  His urinalysis today is negative.   Today, he reports that his urinary symptoms have improved. He is only getting up 3 times a night to urinate. He reports a stronger stream. He denies any discomfort or other urinar symptoms.   A Engineer, structural as utilized during this appointment.   Results for orders placed or performed in visit on 08/02/23  Microscopic Examination   Urine  Result Value Ref Range   WBC, UA 0-5 0 - 5 /hpf   RBC, Urine 0-2 0 - 2 /hpf   Epithelial Cells (non renal) 0-10 0 - 10 /hpf   Bacteria, UA Few None seen/Few  Urinalysis, Complete  Result Value Ref Range   Specific Gravity, UA 1.020 1.005 - 1.030   pH, UA 5.0 5.0 - 7.5   Color, UA Yellow Yellow   Appearance Ur Clear Clear   Leukocytes,UA Negative Negative   Protein,UA Negative Negative/Trace   Glucose, UA Negative Negative   Ketones, UA Negative Negative   RBC, UA Negative Negative   Bilirubin, UA Negative Negative   Urobilinogen, Ur 0.2 0.2 - 1.0 mg/dL   Nitrite, UA Negative Negative   Microscopic Examination See below:   Bladder Scan (Post Void Residual) in office  Result Value Ref Range   Scan Result 36 ml     IPSS     Row Name 08/02/23 1500         International Prostate Symptom Score   How often have you had the  sensation of not emptying your bladder? Not at All     How often have you had to urinate less than every two hours? Not at All     How often have you found you stopped and started again several times when you urinated? Not at All     How often have you found it difficult to postpone urination? Not at All     How often have you had a weak urinary stream? Not at All     How often have you had to strain to start urination? Not at All     How many times did you typically get up at night to urinate? 3 Times     Total IPSS Score 3       Quality of Life due to urinary symptoms   If you were to spend the rest of your life with your urinary condition just the way it is now how would you feel about that? Pleased              Score:  1-7 Mild 8-19 Moderate 20-35 Severe    PMH: Past Medical History:  Diagnosis Date   Arthritis    Bilateral primary osteoarthritis of knee    Heartburn arthritis   HLD (hyperlipidemia)    Hypertension    Pre-diabetes  Prostate enlargement    Rotator cuff tear    Shoulder pain, bilateral     Surgical History: Past Surgical History:  Procedure Laterality Date   cyst removed Rt thigh Right    CYSTOSCOPY N/A 05/29/2023   Procedure: DIAGNOSTIC CYSTOSCOPY;  Surgeon: Jacob Scotland, MD;  Location: [facility];  Service: Urology;  Laterality: N/A;   CYSTOSCOPY WITH URETHRAL DILATATION N/A 07/18/2016   Procedure: CYSTOSCOPY WITH URETHRAL DILATATION WITH FOLEY CATHETER PLACEMENT;  Surgeon: Jacob Scotland, MD;  Location: [facility];  Service: Urology;  Laterality: N/A;   CYSTOSCOPY WITH URETHRAL DILATATION N/A 05/29/2023   Procedure: CYSTOSCOPY WITH URETHRAL BALLOON DILATATION USING OPTILUME;  Surgeon: Jacob Scotland, MD;  Location: [facility];  Service: Urology;  Laterality: N/A;    Home Medications:  Allergies as of 08/02/2023   No Known Allergies      Medication List        Accurate as of August 02, 2023  4:40 PM. If you have any questions, ask your  nurse or doctor.          STOP taking these medications    HYDROcodone-acetaminophen 5-325 MG tablet Commonly known as: NORCO/VICODIN   sulfamethoxazole-trimethoprim 800-160 MG tablet Commonly known as: BACTRIM DS       TAKE these medications    acetaminophen 500 MG tablet Commonly known as: TYLENOL Take 500 mg by mouth every 6 (six) hours as needed.   atenolol 50 MG tablet Commonly known as: TENORMIN Take 50 mg by mouth daily.   chlorthalidone 50 MG tablet Commonly known as: HYGROTON Take 50 mg by mouth daily.   doxazosin 4 MG tablet Commonly known as: CARDURA Take 4 mg by mouth daily. afternoon   finasteride 5 MG tablet Commonly known as: PROSCAR   ibuprofen 800 MG tablet Commonly known as: ADVIL Take 800 mg by mouth every 8 (eight) hours as needed.   losartan 100 MG tablet Commonly known as: COZAAR Take 100 mg by mouth daily.   Omega 3 1000 MG Caps Take by mouth.   simvastatin 80 MG tablet Commonly known as: ZOCOR Take 80 mg by mouth daily.   terbinafine 250 MG tablet Commonly known as: LAMISIL Take 250 mg by mouth daily.   traZODone 50 MG tablet Commonly known as: DESYREL Take 50 mg by mouth at bedtime.        Family History: Family History  Problem Relation Age of Onset   Prostate cancer Neg Hx    Kidney disease Neg Hx     Social History:  reports that he quit smoking about 50 years ago. His smoking use included cigarettes. He has never used smokeless tobacco. He reports current alcohol use of about 6.0 standard drinks of alcohol per week. He reports that he does not use drugs.   Physical Exam: BP (!) 160/80   Pulse (!) 56   Ht 5\' 2"  (1.575 m)   Wt 195 lb (88.5 kg)   BMI 35.67 kg/m   Constitutional:  Alert and oriented, No acute distress. HEENT: [place] AT, moist mucus membranes.  Trachea midline, no masses. Neurologic: Grossly intact, no focal deficits, moving all 4 extremities. Psychiatric: Normal mood and affect.   Assessment  & Plan:    1. BPH - Currently on doxazosin and finasteride - Hesitant to stop these medications due to the risk of symptom recurrence - Plan to continue medications for now, with a potential gradual weaning off starting with doxazosin in 3 months -consider weaning finasteride down the road depending on  symptoms   2. Bulbar urethral stricture - Status post OptiLume 6 weeks ago- likely primary contributor to urinary issues and recurrent UTI - Reports improvement in urinary symptoms, noting a stronger stream and he feels that he is emptying his bladder well  Return in about 6 months (around 01/30/2024) for reassement of urinary symptoms with Sam or Carollee Herter.  I have reviewed the above documentation for accuracy and completeness, and I agree with the above.   Jacob Scotland, MD   [facility] [address], [address] [place], [place] [address] [phone]

## 2023-08-02 NOTE — Patient Instructions (Addendum)
dejar de tomar doxazosin en 3 mes, ( Diciembre 23)

## 2024-02-01 ENCOUNTER — Ambulatory Visit (INDEPENDENT_AMBULATORY_CARE_PROVIDER_SITE_OTHER): Payer: Medicare Other | Admitting: Physician Assistant

## 2024-02-01 VITALS — BP 160/80 | HR 58 | Ht 62.0 in | Wt 192.0 lb

## 2024-02-01 DIAGNOSIS — R351 Nocturia: Secondary | ICD-10-CM | POA: Diagnosis not present

## 2024-02-01 DIAGNOSIS — N401 Enlarged prostate with lower urinary tract symptoms: Secondary | ICD-10-CM | POA: Diagnosis not present

## 2024-02-01 DIAGNOSIS — Z87448 Personal history of other diseases of urinary system: Secondary | ICD-10-CM

## 2024-02-01 LAB — MICROSCOPIC EXAMINATION

## 2024-02-01 LAB — URINALYSIS, COMPLETE
Bilirubin, UA: NEGATIVE
Glucose, UA: NEGATIVE
Ketones, UA: NEGATIVE
Leukocytes,UA: NEGATIVE
Nitrite, UA: NEGATIVE
Protein,UA: NEGATIVE
Specific Gravity, UA: 1.02 (ref 1.005–1.030)
Urobilinogen, Ur: 1 mg/dL (ref 0.2–1.0)
pH, UA: 7 (ref 5.0–7.5)

## 2024-02-01 LAB — BLADDER SCAN AMB NON-IMAGING: SCA Result: 22

## 2024-02-01 NOTE — Progress Notes (Signed)
 02/01/2024 3:34 PM   Jacob Sellers Jun 11, 1946 [id number]  CC: Chief Complaint  Patient presents with   Benign Prostatic Hypertrophy   Urethral Stricture   HPI: Jacob Sellers is a 78 y.o. male with PMH bulbar urethral stricture s/p Optilume dilation with Dr. Apolinar Junes in July 2024 and recurrent UTIs who presents today for 106-month follow-up. He is accompanied today by his son. Visit today facilitated with video Spanish language interpreter Simon.  Today he reports he is doing well with no acute concerns on his current regimen.  He stopped doxazosin but remains on finasteride.  IPSS 5/pleased as below.  PVR 22 mL.  In-office UA today positive for trace intact blood; urine microscopy pan negative.   IPSS     Row Name 02/01/24 1500         International Prostate Symptom Score   How often have you had the sensation of not emptying your bladder? Not at All     How often have you had to urinate less than every two hours? Not at All     How often have you found you stopped and started again several times when you urinated? Not at All     How often have you found it difficult to postpone urination? Less than 1 in 5 times     How often have you had a weak urinary stream? Less than 1 in 5 times     How often have you had to strain to start urination? Less than 1 in 5 times     How many times did you typically get up at night to urinate? 2 Times     Total IPSS Score 5       Quality of Life due to urinary symptoms   If you were to spend the rest of your life with your urinary condition just the way it is now how would you feel about that? Pleased               PMH: Past Medical History:  Diagnosis Date   Arthritis    Bilateral primary osteoarthritis of knee    Heartburn arthritis   HLD (hyperlipidemia)    Hypertension    Pre-diabetes    Prostate enlargement    Rotator cuff tear    Shoulder pain, bilateral     Surgical History: Past Surgical History:  Procedure  Laterality Date   cyst removed Rt thigh Right    CYSTOSCOPY N/A 05/29/2023   Procedure: DIAGNOSTIC CYSTOSCOPY;  Surgeon: [name], MD;  Location: [facility];  Service: Urology;  Laterality: N/A;   CYSTOSCOPY WITH URETHRAL DILATATION N/A 07/18/2016   Procedure: CYSTOSCOPY WITH URETHRAL DILATATION WITH FOLEY CATHETER PLACEMENT;  Surgeon: [name], MD;  Location: [facility];  Service: Urology;  Laterality: N/A;   CYSTOSCOPY WITH URETHRAL DILATATION N/A 05/29/2023   Procedure: CYSTOSCOPY WITH URETHRAL BALLOON DILATATION USING OPTILUME;  Surgeon: [name], MD;  Location: [facility];  Service: Urology;  Laterality: N/A;    Home Medications:  Allergies as of 02/01/2024   No Known Allergies      Medication List        Accurate as of February 01, 2024  3:34 PM. If you have any questions, ask your nurse or doctor.          STOP taking these medications    acetaminophen 500 MG tablet Commonly known as: TYLENOL Stopped by: Carman Ching   doxazosin 4 MG tablet Commonly known as: CARDURA Stopped by: Carman Ching  ibuprofen 800 MG tablet Commonly known as: ADVIL Stopped by: Nicasio Barlowe   Omega 3 1000 MG Caps Stopped by: Carman Ching   terbinafine 250 MG tablet Commonly known as: LAMISIL Stopped by: Carman Ching   traZODone 50 MG tablet Commonly known as: DESYREL Stopped by: Carman Ching       TAKE these medications    atenolol 50 MG tablet Commonly known as: TENORMIN Take 50 mg by mouth daily.   chlorthalidone 50 MG tablet Commonly known as: HYGROTON Take 50 mg by mouth daily.   finasteride 5 MG tablet Commonly known as: PROSCAR   losartan 100 MG tablet Commonly known as: COZAAR Take 100 mg by mouth daily.   simvastatin 80 MG tablet Commonly known as: ZOCOR Take 80 mg by mouth daily.        Allergies:  No Known Allergies  Family History: Family History  Problem Relation Age of Onset    Prostate cancer Neg Hx    Kidney disease Neg Hx     Social History:   reports that he quit smoking about 50 years ago. His smoking use included cigarettes. He has never used smokeless tobacco. He reports current alcohol use of about 6.0 standard drinks of alcohol per week. He reports that he does not use drugs.  Physical Exam: BP (!) 160/80   Pulse (!) 58   Ht 5\' 2"  (1.575 m)   Wt 192 lb (87.1 kg)   BMI 35.12 kg/m   Constitutional:  Alert and oriented, no acute distress, nontoxic appearing HEENT: [place], AT Cardiovascular: No clubbing, cyanosis, or edema Respiratory: Normal respiratory effort, no increased work of breathing Skin: No rashes, bruises or suspicious lesions Neurologic: Grossly intact, no focal deficits, moving all 4 extremities Psychiatric: Normal mood and affect  Laboratory Data: Results for orders placed or performed in visit on 02/01/24  Microscopic Examination   Collection Time: 02/01/24  2:55 PM   Urine  Result Value Ref Range   WBC, UA 0-5 0 - 5 /hpf   RBC, Urine 0-2 0 - 2 /hpf   Epithelial Cells (non renal) 0-10 0 - 10 /hpf   Bacteria, UA Few None seen/Few  Urinalysis, Complete   Collection Time: 02/01/24  2:55 PM  Result Value Ref Range   Specific Gravity, UA 1.020 1.005 - 1.030   pH, UA 7.0 5.0 - 7.5   Color, UA Yellow Yellow   Appearance Ur Clear Clear   Leukocytes,UA Negative Negative   Protein,UA Negative Negative/Trace   Glucose, UA Negative Negative   Ketones, UA Negative Negative   RBC, UA Trace (A) Negative   Bilirubin, UA Negative Negative   Urobilinogen, Ur 1.0 0.2 - 1.0 mg/dL   Nitrite, UA Negative Negative   Microscopic Examination See below:   Bladder Scan (Post Void Residual) in office   Collection Time: 02/01/24  3:11 PM  Result Value Ref Range   SCA Result 22 ml    Assessment & Plan:   1. Benign prostatic hyperplasia with nocturia (Primary) Mild LUTS off doxazosin and he is emptying well.  We discussed that he can stop  finasteride after he completes his last bottle of it and I will see him back next year for symptom recheck.  If his LUTS worsen off finasteride, can resume it. - Urinalysis, Complete - Bladder Scan (Post Void Residual) in office  2. History of urethral stricture No current complaints and he continues to empty well.  UA is bland.  Will continue to monitor. - Urinalysis,  Complete - Bladder Scan (Post Void Residual) in office  Return in about 1 year (around 01/31/2025) for Annual IPSS/PVR.  Carman Ching, PA-C  [facility] [place] [address] [place], [place] [address] [phone]

## 2024-02-26 ENCOUNTER — Ambulatory Visit: Payer: Self-pay | Admitting: Urology

## 2025-01-28 ENCOUNTER — Ambulatory Visit: Admitting: Physician Assistant
